# Patient Record
Sex: Female | Born: 1955 | Race: White | Hispanic: No | Marital: Married | Smoking: Never smoker
Health system: Southern US, Community
[De-identification: ages and names within clinical notes are randomized; demographics above are authoritative.]

## PROBLEM LIST (undated history)

## (undated) DIAGNOSIS — T7840XA Allergy, unspecified, initial encounter: Secondary | ICD-10-CM

## (undated) DIAGNOSIS — K219 Gastro-esophageal reflux disease without esophagitis: Secondary | ICD-10-CM

## (undated) DIAGNOSIS — E785 Hyperlipidemia, unspecified: Secondary | ICD-10-CM

## (undated) DIAGNOSIS — J45909 Unspecified asthma, uncomplicated: Secondary | ICD-10-CM

## (undated) DIAGNOSIS — M8580 Other specified disorders of bone density and structure, unspecified site: Secondary | ICD-10-CM

## (undated) HISTORY — DX: Other specified disorders of bone density and structure, unspecified site: M85.80

## (undated) HISTORY — DX: Allergy, unspecified, initial encounter: T78.40XA

## (undated) HISTORY — DX: Unspecified asthma, uncomplicated: J45.909

## (undated) HISTORY — PX: TUBAL LIGATION: SHX77

## (undated) HISTORY — DX: Hyperlipidemia, unspecified: E78.5

## (undated) HISTORY — DX: Gastro-esophageal reflux disease without esophagitis: K21.9

## (undated) HISTORY — PX: COLONOSCOPY: SHX174

---

## 2000-08-07 ENCOUNTER — Other Ambulatory Visit: Admission: RE | Admit: 2000-08-07 | Discharge: 2000-08-07 | Payer: Self-pay | Admitting: Family Medicine

## 2002-05-19 ENCOUNTER — Other Ambulatory Visit: Admission: RE | Admit: 2002-05-19 | Discharge: 2002-05-19 | Payer: Self-pay | Admitting: Family Medicine

## 2012-05-27 ENCOUNTER — Encounter (INDEPENDENT_AMBULATORY_CARE_PROVIDER_SITE_OTHER): Payer: 59 | Admitting: Obstetrics & Gynecology

## 2012-05-27 DIAGNOSIS — N952 Postmenopausal atrophic vaginitis: Secondary | ICD-10-CM

## 2012-06-16 ENCOUNTER — Other Ambulatory Visit: Payer: Self-pay | Admitting: *Deleted

## 2012-06-16 DIAGNOSIS — Z780 Asymptomatic menopausal state: Secondary | ICD-10-CM

## 2012-06-24 ENCOUNTER — Encounter: Payer: Self-pay | Admitting: *Deleted

## 2012-06-25 ENCOUNTER — Telehealth: Payer: Self-pay | Admitting: Nurse Practitioner

## 2012-06-25 ENCOUNTER — Other Ambulatory Visit: Payer: Self-pay | Admitting: Nurse Practitioner

## 2012-06-25 MED ORDER — ATORVASTATIN CALCIUM 20 MG PO TABS: 20.0000 mg | ORAL_TABLET | Freq: Every day | ORAL | Status: DC

## 2012-06-25 NOTE — Telephone Encounter (Signed)
Sent to [facility]

## 2012-06-25 NOTE — Telephone Encounter (Signed)
Patient aware meds sent to [facility]

## 2012-06-25 NOTE — Telephone Encounter (Signed)
Pt was seen here in January. Can't have labs done until after April 1st. Needs refill on cholesterol meds. [facility]

## 2012-07-07 ENCOUNTER — Other Ambulatory Visit (INDEPENDENT_AMBULATORY_CARE_PROVIDER_SITE_OTHER): Payer: 59

## 2012-07-07 DIAGNOSIS — E559 Vitamin D deficiency, unspecified: Secondary | ICD-10-CM

## 2012-07-07 DIAGNOSIS — E785 Hyperlipidemia, unspecified: Secondary | ICD-10-CM

## 2012-07-07 LAB — BASIC METABOLIC PANEL
BUN: 14 mg/dL (ref 6–23)
CO2: 27 mEq/L (ref 19–32)
Chloride: 104 mEq/L (ref 96–112)
Glucose, Bld: 94 mg/dL (ref 70–99)
Potassium: 4.9 mEq/L (ref 3.5–5.3)
Sodium: 139 mEq/L (ref 135–145)

## 2012-07-07 LAB — HEPATIC FUNCTION PANEL
ALT: 67 U/L — ABNORMAL HIGH (ref 0–35)
Alkaline Phosphatase: 101 U/L (ref 39–117)
Bilirubin, Direct: 0.1 mg/dL (ref 0.0–0.3)
Indirect Bilirubin: 0.6 mg/dL (ref 0.0–0.9)

## 2012-07-07 NOTE — Progress Notes (Signed)
Pt came in for labs only 

## 2012-07-08 LAB — NMR LIPOPROFILE WITH LIPIDS
Cholesterol, Total: 161 mg/dL (ref <200)
HDL Particle Number: 35.6 umol/L (ref >=30.5)
LDL Particle Number: 726 nmol/L (ref <1000)
Large VLDL-P: 2.3 nmol/L (ref <=2.7)
Small LDL Particle Number: 192 nmol/L (ref <=527)
Triglycerides: 70 mg/dL (ref <150)
VLDL Size: 46.2 nm (ref <=46.6)

## 2012-08-13 ENCOUNTER — Ambulatory Visit (INDEPENDENT_AMBULATORY_CARE_PROVIDER_SITE_OTHER): Payer: 59

## 2012-08-13 ENCOUNTER — Ambulatory Visit: Payer: Self-pay

## 2012-08-13 DIAGNOSIS — Z780 Asymptomatic menopausal state: Secondary | ICD-10-CM

## 2013-03-24 ENCOUNTER — Other Ambulatory Visit: Payer: Self-pay | Admitting: Nurse Practitioner

## 2013-03-30 ENCOUNTER — Other Ambulatory Visit: Payer: Self-pay | Admitting: General Practice

## 2013-03-30 ENCOUNTER — Telehealth: Payer: Self-pay | Admitting: Nurse Practitioner

## 2013-03-30 MED ORDER — ATORVASTATIN CALCIUM 20 MG PO TABS: 20.0000 mg | ORAL_TABLET | Freq: Every day | ORAL | Status: DC

## 2013-03-30 NOTE — Telephone Encounter (Signed)
Script sent to pharmacy.

## 2013-05-04 ENCOUNTER — Encounter (INDEPENDENT_AMBULATORY_CARE_PROVIDER_SITE_OTHER): Payer: Self-pay

## 2013-05-04 ENCOUNTER — Ambulatory Visit (INDEPENDENT_AMBULATORY_CARE_PROVIDER_SITE_OTHER): Payer: 59 | Admitting: Nurse Practitioner

## 2013-05-04 ENCOUNTER — Encounter: Payer: Self-pay | Admitting: Nurse Practitioner

## 2013-05-04 VITALS — BP 119/75 | HR 70 | Temp 96.9°F | Ht 62.25 in | Wt 149.0 lb

## 2013-05-04 DIAGNOSIS — Z124 Encounter for screening for malignant neoplasm of cervix: Secondary | ICD-10-CM

## 2013-05-04 DIAGNOSIS — Z01419 Encounter for gynecological examination (general) (routine) without abnormal findings: Secondary | ICD-10-CM

## 2013-05-04 DIAGNOSIS — Z0000 Encounter for general adult medical examination without abnormal findings: Secondary | ICD-10-CM

## 2013-05-04 DIAGNOSIS — E785 Hyperlipidemia, unspecified: Secondary | ICD-10-CM

## 2013-05-04 LAB — POCT URINALYSIS DIPSTICK
BILIRUBIN UA: NEGATIVE
Blood, UA: NEGATIVE
GLUCOSE UA: NEGATIVE
KETONES UA: NEGATIVE
Leukocytes, UA: NEGATIVE
Nitrite, UA: NEGATIVE
Protein, UA: NEGATIVE
SPEC GRAV UA: 1.015
Urobilinogen, UA: NEGATIVE
pH, UA: 7.5

## 2013-05-04 LAB — POCT CBC
GRANULOCYTE PERCENT: 63.5 % (ref 37–80)
HCT, POC: 46.7 % (ref 37.7–47.9)
HEMOGLOBIN: 14.5 g/dL (ref 12.2–16.2)
Lymph, poc: 2.6 (ref 0.6–3.4)
MCH, POC: 28.6 pg (ref 27–31.2)
MCHC: 31.1 g/dL — AB (ref 31.8–35.4)
MCV: 91.9 fL (ref 80–97)
MPV: 8.8 fL (ref 0–99.8)
POC GRANULOCYTE: 5.3 (ref 2–6.9)
POC LYMPH %: 30.5 % (ref 10–50)
Platelet Count, POC: 269 10*3/uL (ref 142–424)
RBC: 5.1 M/uL (ref 4.04–5.48)
RDW, POC: 13.6 %
WBC: 8.4 10*3/uL (ref 4.6–10.2)

## 2013-05-04 LAB — POCT UA - MICROSCOPIC ONLY
Casts, Ur, LPF, POC: NEGATIVE
Crystals, Ur, HPF, POC: NEGATIVE
RBC, URINE, MICROSCOPIC: NEGATIVE
Yeast, UA: NEGATIVE

## 2013-05-04 NOTE — Patient Instructions (Signed)

## 2013-05-04 NOTE — Progress Notes (Signed)
   Subjective:    Patient ID: Janice Long, female    DOB: 01-25-1956, 58 y.o.   MRN: [id number]   Patient in today for annual wellness exam and PAP- she is doing well without complaints.  Hyperlipidemia The current episode started more than 1 year ago. The problem is uncontrolled. Recent lipid tests were reviewed and are high. She has no history of diabetes, hypothyroidism, liver disease or obesity. There are no known factors aggravating her hyperlipidemia. Current antihyperlipidemic treatment includes statins. The current treatment provides moderate improvement of lipids. Compliance problems include adherence to diet.      Review of Systems  Constitutional: Negative.   HENT: Negative.   Eyes: Negative.   Respiratory: Negative.   Cardiovascular: Negative.   Genitourinary: Negative.   Musculoskeletal: Negative.   Neurological: Negative.   All other systems reviewed and are negative.       Objective:   Physical Exam  Constitutional: She is oriented to person, place, and time. She appears well-developed and well-nourished.  HENT:  Head: Normocephalic.  Right Ear: Hearing, tympanic membrane, external ear and ear canal normal.  Left Ear: Hearing, tympanic membrane, external ear and ear canal normal.  Nose: Nose normal.  Mouth/Throat: Uvula is midline and oropharynx is clear and moist.  Eyes: Conjunctivae and EOM are normal. Pupils are equal, round, and reactive to light.  Neck: Normal range of motion and full passive range of motion without pain. Neck supple. No JVD present. Carotid bruit is not present. No mass and no thyromegaly present.  Cardiovascular: Normal rate, normal heart sounds and intact distal pulses.   No murmur heard. Pulmonary/Chest: Effort normal and breath sounds normal. Right breast exhibits no inverted nipple, no mass, no nipple discharge, no skin change and no tenderness. Left breast exhibits no inverted nipple, no mass, no nipple discharge, no skin change and  no tenderness.  Abdominal: Soft. Bowel sounds are normal. She exhibits no mass. There is no tenderness.  Genitourinary: Vagina normal and uterus normal. No breast swelling, tenderness, discharge or bleeding.  bimanual exam-No adnexal masses or tenderness. Cervix parous and pink no discharge  Musculoskeletal: Normal range of motion.  Lymphadenopathy:    She has no cervical adenopathy.  Neurological: She is alert and oriented to person, place, and time.  Skin: Skin is warm and dry.  Psychiatric: She has a normal mood and affect. Her behavior is normal. Judgment and thought content normal.   BP 119/75  Pulse 70  Temp(Src) 96.9 F (36.1 C) (Oral)  Ht 5' 2.25" (1.581 m)  Wt 149 lb (67.586 kg)  BMI 27.04 kg/m2        Assessment & Plan:   1. Annual physical exam   2. Hyperlipidemia LDL goal < 100   3. Encounter for routine gynecological examination    Orders Placed This Encounter  Procedures  . CMP14+EGFR  . NMR, lipoprofile  . Thyroid Panel With TSH  . POCT urinalysis dipstick  . POCT UA - Microscopic Only  . POCT CBC    Labs pending Health maintenance reviewed Diet and exercise encouraged Continue all meds Follow up  In 6 months   [place], FNP

## 2013-05-05 LAB — PAP IG W/ RFLX HPV ASCU: PAP Smear Comment: 0

## 2013-05-06 ENCOUNTER — Telehealth: Payer: Self-pay | Admitting: *Deleted

## 2013-05-06 LAB — CMP14+EGFR
ALBUMIN: 4.6 g/dL (ref 3.5–5.5)
ALT: 25 IU/L (ref 0–32)
AST: 24 IU/L (ref 0–40)
Albumin/Globulin Ratio: 2 (ref 1.1–2.5)
Alkaline Phosphatase: 74 IU/L (ref 39–117)
BILIRUBIN TOTAL: 0.4 mg/dL (ref 0.0–1.2)
BUN/Creatinine Ratio: 13 (ref 9–23)
BUN: 10 mg/dL (ref 6–24)
CALCIUM: 9.7 mg/dL (ref 8.7–10.2)
CHLORIDE: 100 mmol/L (ref 97–108)
CO2: 25 mmol/L (ref 18–29)
Creatinine, Ser: 0.8 mg/dL (ref 0.57–1.00)
GFR calc non Af Amer: 82 mL/min/{1.73_m2} (ref >59)
GFR, EST AFRICAN AMERICAN: 94 mL/min/{1.73_m2} (ref >59)
GLUCOSE: 92 mg/dL (ref 65–99)
Globulin, Total: 2.3 g/dL (ref 1.5–4.5)
Potassium: 4.7 mmol/L (ref 3.5–5.2)
Sodium: 140 mmol/L (ref 134–144)
TOTAL PROTEIN: 6.9 g/dL (ref 6.0–8.5)

## 2013-05-06 LAB — NMR, LIPOPROFILE
CHOLESTEROL: 159 mg/dL (ref <200)
HDL Cholesterol by NMR: 61 mg/dL (ref >=40)
HDL Particle Number: 35.8 umol/L (ref >=30.5)
LDL PARTICLE NUMBER: 1164 nmol/L — AB (ref <1000)
LDL SIZE: 20.8 nm (ref >20.5)
LDLC SERPL CALC-MCNC: 80 mg/dL (ref <100)
LP-IR Score: 41 (ref <=45)
Small LDL Particle Number: 577 nmol/L — ABNORMAL HIGH (ref <=527)
Triglycerides by NMR: 91 mg/dL (ref <150)

## 2013-05-06 LAB — THYROID PANEL WITH TSH
Free Thyroxine Index: 1.9 (ref 1.2–4.9)
T3 UPTAKE RATIO: 27 % (ref 24–39)
T4, Total: 7.2 ug/dL (ref 4.5–12.0)
TSH: 1.37 u[IU]/mL (ref 0.450–4.500)

## 2013-05-06 NOTE — Telephone Encounter (Signed)
Message copied by Almeta MonasSTONE, Jeramia Saleeby M on Wed May 06, 2013  9:40 AM ------      Message from: Bennie PieriniMARTIN, MARY-MARGARET      Created: Wed May 06, 2013  8:12 AM       All labs look great - cholesterol looks good but LDL particle numbers have increased since last visit-= continue all meds- low fat diet and exercise and recheck in 3 months to make sure do not continue to worsen ------

## 2013-05-06 NOTE — Telephone Encounter (Signed)
LM, all labs look great except cholesterol is up some.  Diet and exercise more to help with this.

## 2013-06-08 ENCOUNTER — Other Ambulatory Visit: Payer: Self-pay

## 2013-06-08 MED ORDER — ATORVASTATIN CALCIUM 20 MG PO TABS: 20.0000 mg | ORAL_TABLET | Freq: Every day | ORAL | Status: DC

## 2014-02-01 ENCOUNTER — Other Ambulatory Visit: Payer: Self-pay | Admitting: Nurse Practitioner

## 2014-03-03 ENCOUNTER — Other Ambulatory Visit: Payer: Self-pay | Admitting: Nurse Practitioner

## 2014-03-29 ENCOUNTER — Other Ambulatory Visit: Payer: Self-pay | Admitting: Family Medicine

## 2014-06-01 ENCOUNTER — Encounter (INDEPENDENT_AMBULATORY_CARE_PROVIDER_SITE_OTHER): Payer: Self-pay

## 2014-06-01 ENCOUNTER — Encounter: Payer: Self-pay | Admitting: Nurse Practitioner

## 2014-06-01 ENCOUNTER — Ambulatory Visit (INDEPENDENT_AMBULATORY_CARE_PROVIDER_SITE_OTHER): Payer: 59 | Admitting: Nurse Practitioner

## 2014-06-01 VITALS — BP 112/68 | HR 61 | Temp 97.2°F | Ht 62.0 in | Wt 133.0 lb

## 2014-06-01 DIAGNOSIS — Z0000 Encounter for general adult medical examination without abnormal findings: Secondary | ICD-10-CM | POA: Diagnosis not present

## 2014-06-01 DIAGNOSIS — Z01419 Encounter for gynecological examination (general) (routine) without abnormal findings: Secondary | ICD-10-CM

## 2014-06-01 DIAGNOSIS — E785 Hyperlipidemia, unspecified: Secondary | ICD-10-CM | POA: Diagnosis not present

## 2014-06-01 LAB — POCT UA - MICROSCOPIC ONLY
BACTERIA, U MICROSCOPIC: NEGATIVE
CASTS, UR, LPF, POC: NEGATIVE
Crystals, Ur, HPF, POC: NEGATIVE
Mucus, UA: NEGATIVE
YEAST UA: NEGATIVE

## 2014-06-01 LAB — POCT URINALYSIS DIPSTICK
Bilirubin, UA: NEGATIVE
Glucose, UA: NEGATIVE
KETONES UA: NEGATIVE
Nitrite, UA: NEGATIVE
PROTEIN UA: NEGATIVE
SPEC GRAV UA: 1.01
Urobilinogen, UA: NEGATIVE
pH, UA: 6

## 2014-06-01 MED ORDER — ATORVASTATIN CALCIUM 20 MG PO TABS: ORAL_TABLET | ORAL | Status: DC

## 2014-06-01 NOTE — Patient Instructions (Signed)
Pap Test A Pap test checks the cells on the surface of your cervix. Your doctor will look for cell changes that are not normal, an infection, or cancer. If the cells no longer look normal, it is called dysplasia. Dysplasia can turn into cancer. Regular Pap tests are important to stop cancer from developing. BEFORE THE PROCEDURE  Ask your doctor when to schedule your Pap test. Timing the test around your period may be important.  Do not douche or have sex (intercourse) for 24 hours before the test.  Do not put creams on your vagina or use tampons for 24 hours before the test.  Go pee (urinate) just before the test. PROCEDURE  You will lie on an exam table with your feet in stirrups.  A warm metal or plastic tool (speculum) will be put in your vagina to open it up.  Your doctor will use a small, plastic brush or wooden spatula to take cells from your cervix.  The cells will be put in a lab container.  The cells will be checked under a microscope to see if they are normal or not. AFTER THE PROCEDURE Get your test results. If they are abnormal, you may need more tests. Document Released: 04/21/2010 Document Revised: 06/11/2011 Document Reviewed: 03/15/2011 Exit[employer] Patient Information 2015 [employer]. This information is not intended to replace advice given to you by your health care provider. Make sure you discuss any questions you have with your health care provider.  

## 2014-06-01 NOTE — Addendum Note (Signed)
Addended by: Prescott GumLAND, Conley Pawling M on: 06/01/2014 11:32 AM   Modules accepted: Orders

## 2014-06-01 NOTE — Progress Notes (Signed)
   Subjective:    Patient ID: Janice Long, female    DOB: 1955-12-29, 59 y.o.   MRN: [id number]   Patient in today for annual wellness exam and PAP- she is doing well no acute complaints.  Hyperlipidemia This is a chronic problem. The current episode started more than 1 year ago. The problem is controlled. Pertinent negatives include no chest pain. Current antihyperlipidemic treatment includes statins. The current treatment provides significant improvement of lipids. There are no compliance problems.  Risk factors for coronary artery disease include post-menopausal and dyslipidemia.     Review of Systems  Constitutional: Negative.   HENT: Negative.   Eyes: Negative.   Respiratory: Negative.   Cardiovascular: Negative.  Negative for chest pain.  Genitourinary: Negative.   Musculoskeletal: Negative.   Neurological: Negative.   All other systems reviewed and are negative.      Objective:   Physical Exam  Constitutional: She is oriented to person, place, and time. She appears well-developed and well-nourished.  HENT:  Head: Normocephalic.  Right Ear: Hearing, tympanic membrane, external ear and ear canal normal.  Left Ear: Hearing, tympanic membrane, external ear and ear canal normal.  Nose: Nose normal.  Mouth/Throat: Uvula is midline and oropharynx is clear and moist.  Eyes: Conjunctivae and EOM are normal. Pupils are equal, round, and reactive to light.  Neck: Normal range of motion and full passive range of motion without pain. Neck supple. No JVD present. Carotid bruit is not present. No thyroid mass and no thyromegaly present.  Cardiovascular: Normal rate, normal heart sounds and intact distal pulses.   No murmur heard. Pulmonary/Chest: Effort normal and breath sounds normal. Right breast exhibits no inverted nipple, no mass, no nipple discharge, no skin change and no tenderness. Left breast exhibits no inverted nipple, no mass, no nipple discharge, no skin change and no  tenderness.  Abdominal: Soft. Bowel sounds are normal. She exhibits no mass. There is no tenderness.  Genitourinary: Vagina normal and uterus normal. No breast swelling, tenderness, discharge or bleeding.  bimanual exam-No adnexal masses or tenderness. Cervix parous and pink no discharge  Musculoskeletal: Normal range of motion.  Lymphadenopathy:    She has no cervical adenopathy.  Neurological: She is alert and oriented to person, place, and time.  Skin: Skin is warm and dry.  Psychiatric: She has a normal mood and affect. Her behavior is normal. Judgment and thought content normal.   BP 112/68 mmHg  Pulse 61  Temp(Src) 97.2 F (36.2 C) (Oral)  Ht $R'5\' 2"'WZ$  (1.575 m)  Wt 133 lb (60.328 kg)  BMI 24.32 kg/m2        Assessment & Plan:    1. Annual physical exam - POCT UA - Microscopic Only - POCT urinalysis dipstick - CMP14+EGFR - CBC - Basic metabolic panel - Thyroid Panel With TSH - Vit D  25 hydroxy (rtn osteoporosis monitoring)  2. Hyperlipidemia with target LDL less than 100 Low fat diet - NMR, lipoprofile  3. Encounter for routine gynecological examination - Pap IG w/ reflex to HPV when ASC-U    Labs pending Health maintenance reviewed Diet and exercise encouraged Continue all meds Follow up  In 6 month   [place], FNP

## 2014-06-01 NOTE — Addendum Note (Signed)
Addended by: Prescott GumLAND, Madelin Weseman M on: 06/01/2014 11:26 AM   Modules accepted: Kipp BroodSmartSet

## 2014-06-02 LAB — NMR, LIPOPROFILE
CHOLESTEROL: 182 mg/dL (ref 100–199)
HDL Cholesterol by NMR: 72 mg/dL (ref >39)
HDL Particle Number: 32.6 umol/L (ref >=30.5)
LDL Particle Number: 945 nmol/L (ref <1000)
LDL SIZE: 21.1 nm (ref >20.5)
LDL-C: 95 mg/dL (ref 0–99)
Small LDL Particle Number: 244 nmol/L (ref <=527)
Triglycerides by NMR: 76 mg/dL (ref 0–149)

## 2014-06-02 LAB — CBC
HCT: 43.4 % (ref 34.0–46.6)
HEMOGLOBIN: 14.2 g/dL (ref 11.1–15.9)
MCH: 29 pg (ref 26.6–33.0)
MCHC: 32.7 g/dL (ref 31.5–35.7)
MCV: 89 fL (ref 79–97)
Platelets: 294 10*3/uL (ref 150–379)
RBC: 4.9 x10E6/uL (ref 3.77–5.28)
RDW: 14.6 % (ref 12.3–15.4)
WBC: 6.7 10*3/uL (ref 3.4–10.8)

## 2014-06-02 LAB — CMP14+EGFR
ALBUMIN: 4.7 g/dL (ref 3.5–5.5)
ALT: 22 IU/L (ref 0–32)
AST: 22 IU/L (ref 0–40)
Albumin/Globulin Ratio: 1.7 (ref 1.1–2.5)
Alkaline Phosphatase: 71 IU/L (ref 39–117)
BUN/Creatinine Ratio: 18 (ref 9–23)
BUN: 17 mg/dL (ref 6–24)
Bilirubin Total: 0.5 mg/dL (ref 0.0–1.2)
CALCIUM: 10.1 mg/dL (ref 8.7–10.2)
CO2: 24 mmol/L (ref 18–29)
CREATININE: 0.95 mg/dL (ref 0.57–1.00)
Chloride: 102 mmol/L (ref 97–108)
GFR calc Af Amer: 76 mL/min/{1.73_m2} (ref >59)
GFR calc non Af Amer: 66 mL/min/{1.73_m2} (ref >59)
GLUCOSE: 89 mg/dL (ref 65–99)
Globulin, Total: 2.8 g/dL (ref 1.5–4.5)
Potassium: 4.9 mmol/L (ref 3.5–5.2)
Sodium: 142 mmol/L (ref 134–144)
TOTAL PROTEIN: 7.5 g/dL (ref 6.0–8.5)

## 2014-06-02 LAB — THYROID PANEL WITH TSH
FREE THYROXINE INDEX: 2.1 (ref 1.2–4.9)
T3 UPTAKE RATIO: 24 % (ref 24–39)
T4 TOTAL: 8.6 ug/dL (ref 4.5–12.0)
TSH: 1.18 u[IU]/mL (ref 0.450–4.500)

## 2014-06-02 LAB — VITAMIN D 25 HYDROXY (VIT D DEFICIENCY, FRACTURES): VIT D 25 HYDROXY: 28.5 ng/mL — AB (ref 30.0–100.0)

## 2014-06-04 LAB — PAP IG W/ RFLX HPV ASCU: PAP Smear Comment: 0

## 2015-01-19 ENCOUNTER — Other Ambulatory Visit: Payer: Self-pay | Admitting: Nurse Practitioner

## 2015-01-19 NOTE — Telephone Encounter (Signed)
Last seen 06/01/14 MMM Last lipid 06/01/14

## 2015-01-19 NOTE — Telephone Encounter (Signed)
Last refill without being seen 

## 2015-01-19 NOTE — Telephone Encounter (Signed)
Detailed message left for patient that rx has be sent into pharmacy and will she will need to be seen for any further.

## 2015-02-27 ENCOUNTER — Other Ambulatory Visit: Payer: Self-pay | Admitting: Nurse Practitioner

## 2015-02-28 NOTE — Telephone Encounter (Signed)
Last seen and last lipid 06/01/14  MMM

## 2015-02-28 NOTE — Telephone Encounter (Signed)
Last refill without being seen 

## 2015-03-15 ENCOUNTER — Other Ambulatory Visit: Payer: Self-pay | Admitting: Nurse Practitioner

## 2015-03-15 NOTE — Telephone Encounter (Signed)
Last seen 06/01/14  MMM

## 2015-05-07 ENCOUNTER — Other Ambulatory Visit: Payer: Self-pay | Admitting: Nurse Practitioner

## 2015-05-09 NOTE — Telephone Encounter (Signed)
Last labs 3/16

## 2015-05-09 NOTE — Telephone Encounter (Signed)
Patient scheduled an appointment for 3/2 for a physical. Patient states that she only see's you once a year and wants to know if you will fill until then

## 2015-05-09 NOTE — Telephone Encounter (Signed)
lipitor refill denied-Patient NTBS for follow up and lab work  

## 2015-05-09 NOTE — Telephone Encounter (Signed)
Okay for her to wait until appointment- i need to make sure kidney function is okay

## 2015-05-14 ENCOUNTER — Other Ambulatory Visit: Payer: Self-pay | Admitting: Nurse Practitioner

## 2015-06-02 ENCOUNTER — Ambulatory Visit: Payer: 59

## 2015-06-02 ENCOUNTER — Encounter: Payer: Self-pay | Admitting: Nurse Practitioner

## 2015-06-02 ENCOUNTER — Ambulatory Visit (INDEPENDENT_AMBULATORY_CARE_PROVIDER_SITE_OTHER): Payer: 59 | Admitting: Nurse Practitioner

## 2015-06-02 ENCOUNTER — Ambulatory Visit (INDEPENDENT_AMBULATORY_CARE_PROVIDER_SITE_OTHER): Payer: 59

## 2015-06-02 VITALS — BP 110/72 | HR 75 | Temp 97.2°F | Ht 62.0 in | Wt 146.0 lb

## 2015-06-02 DIAGNOSIS — Z13820 Encounter for screening for osteoporosis: Secondary | ICD-10-CM

## 2015-06-02 DIAGNOSIS — Z1159 Encounter for screening for other viral diseases: Secondary | ICD-10-CM

## 2015-06-02 DIAGNOSIS — Z1212 Encounter for screening for malignant neoplasm of rectum: Secondary | ICD-10-CM

## 2015-06-02 DIAGNOSIS — Z6826 Body mass index (BMI) 26.0-26.9, adult: Secondary | ICD-10-CM | POA: Diagnosis not present

## 2015-06-02 DIAGNOSIS — E785 Hyperlipidemia, unspecified: Secondary | ICD-10-CM | POA: Diagnosis not present

## 2015-06-02 DIAGNOSIS — Z0000 Encounter for general adult medical examination without abnormal findings: Secondary | ICD-10-CM | POA: Diagnosis not present

## 2015-06-02 DIAGNOSIS — N904 Leukoplakia of vulva: Secondary | ICD-10-CM | POA: Diagnosis not present

## 2015-06-02 DIAGNOSIS — Z23 Encounter for immunization: Secondary | ICD-10-CM

## 2015-06-02 DIAGNOSIS — Z01419 Encounter for gynecological examination (general) (routine) without abnormal findings: Secondary | ICD-10-CM

## 2015-06-02 DIAGNOSIS — E559 Vitamin D deficiency, unspecified: Secondary | ICD-10-CM

## 2015-06-02 LAB — POCT URINALYSIS DIPSTICK
BILIRUBIN UA: NEGATIVE
Glucose, UA: NEGATIVE
Ketones, UA: NEGATIVE
Nitrite, UA: NEGATIVE
PH UA: 5
Protein, UA: NEGATIVE
Spec Grav, UA: >=1.03
Urobilinogen, UA: NEGATIVE

## 2015-06-02 LAB — POCT UA - MICROSCOPIC ONLY
Casts, Ur, LPF, POC: NEGATIVE
Crystals, Ur, HPF, POC: NEGATIVE
YEAST UA: NEGATIVE

## 2015-06-02 MED ORDER — ATORVASTATIN CALCIUM 20 MG PO TABS: ORAL_TABLET | ORAL | Status: DC

## 2015-06-02 MED ORDER — CLOBETASOL PROP EMOLLIENT BASE 0.05 % EX CREA: 1.0000 "application " | TOPICAL_CREAM | Freq: Two times a day (BID) | CUTANEOUS | Status: DC

## 2015-06-02 NOTE — Addendum Note (Signed)
Addended by: Bennie Pierini on: 06/02/2015 08:38 AM   Modules accepted: Kipp Brood

## 2015-06-02 NOTE — Patient Instructions (Signed)
Lichen Sclerosus Lichen sclerosus is a skin problem. It can happen on any part of the body, but it commonly involves the anal or genital areas. It can cause itching and discomfort in these areas. Treatment can help to control symptoms. When the genital area is affected, getting treatment is important because the condition can cause scarring that may lead to other problems. CAUSES The cause of this condition is not known. It could be the result of an overactive immune system or a lack of certain hormones. Lichen sclerosus is not an infection or a fungus. It is not passed from one person to another (not contagious). RISK FACTORS This condition is more likely to develop in women, usually after menopause. SYMPTOMS Symptoms of this condition include:  Thin, wrinkled, white areas on the skin.  Thickened white areas on the skin.  Red and swollen patches (lesions) on the skin.  Tears or cracks in the skin.  Bruising.  Blood blisters.  Severe itching. You may also have pain, itching, or burning with urination. Constipation is also common in people with lichen sclerosus. DIAGNOSIS This condition may be diagnosed with a physical exam. In some cases, a tissue sample (biopsy sample) may be removed to be looked at under a microscope. TREATMENT This condition is usually treated with medicated creams or ointments (topical steroids) that are applied over the affected areas. HOME CARE INSTRUCTIONS  Take over-the-counter and prescription medicines only as told by your health care provider.  Use creams or ointments as told by your health care provider.  Do not scratch the affected areas of skin.  Women should keep the vaginal area as clean and dry as possible.  Keep all follow-up visits as told by your health care provider. This is important. SEEK MEDICAL CARE IF:  You have increasing redness, swelling, or pain in the affected area.  You have fluid, blood, or pus coming from the affected  area.  You have new lesions on your skin.  You have pain or burning with urination.  You have pain during sex.   This information is not intended to replace advice given to you by your health care provider. Make sure you discuss any questions you have with your health care provider.   Document Released: 08/09/2010 Document Revised: 12/08/2014 Document Reviewed: 06/14/2014 Elsevier Interactive Patient Education 2016 Elsevier Inc.  

## 2015-06-02 NOTE — Progress Notes (Signed)
Subjective:    Patient ID: Janice Long, female    DOB: July 22, 1955, 60 y.o.   MRN: [id number]    Patient here today for her annual physical exam and PAP. She is doing well today without complaints.  Outpatient Encounter Prescriptions as of 06/02/2015  Medication Sig  . atorvastatin (LIPITOR) 20 MG tablet TAKE 1 TABLET (20 MG TOTAL) BY MOUTH DAILY.  . cholecalciferol (VITAMIN D) 1000 units tablet Take 1,000 Units by mouth daily.   No facility-administered encounter medications on file as of 06/02/2015.      Hyperlipidemia This is a chronic problem. The current episode started more than 1 year ago. The problem is controlled. Pertinent negatives include no chest pain. Current antihyperlipidemic treatment includes statins. The current treatment provides significant improvement of lipids. There are no compliance problems.  Risk factors for coronary artery disease include post-menopausal and dyslipidemia.     Review of Systems  Constitutional: Negative.   HENT: Negative.   Eyes: Negative.   Respiratory: Negative.   Cardiovascular: Negative.  Negative for chest pain.  Genitourinary: Negative.   Musculoskeletal: Negative.   Neurological: Negative.   All other systems reviewed and are negative.      Objective:   Physical Exam  Constitutional: She is oriented to person, place, and time. She appears well-developed and well-nourished.  HENT:  Head: Normocephalic.  Right Ear: Hearing, tympanic membrane, external ear and ear canal normal.  Left Ear: Hearing, tympanic membrane, external ear and ear canal normal.  Nose: Nose normal.  Mouth/Throat: Uvula is midline and oropharynx is clear and moist.  Eyes: Conjunctivae and EOM are normal. Pupils are equal, round, and reactive to light.  Neck: Normal range of motion and full passive range of motion without pain. Neck supple. No JVD present. Carotid bruit is not present. No thyroid mass and no thyromegaly present.  Cardiovascular: Normal  rate, normal heart sounds and intact distal pulses.   No murmur heard. Pulmonary/Chest: Effort normal and breath sounds normal. Right breast exhibits no inverted nipple, no mass, no nipple discharge, no skin change and no tenderness. Left breast exhibits no inverted nipple, no mass, no nipple discharge, no skin change and no tenderness.  Abdominal: Soft. Bowel sounds are normal. She exhibits no mass. There is no tenderness.  Genitourinary: Vagina normal and uterus normal. No breast swelling, tenderness, discharge or bleeding.  bimanual exam-No adnexal masses or tenderness. Cervix parous and pink no discharge Thickened white skin around upper labia bil with slight adhesions- and around annual area  Musculoskeletal: Normal range of motion.  Lymphadenopathy:    She has no cervical adenopathy.  Neurological: She is alert and oriented to person, place, and time.  Skin: Skin is warm and dry.  Psychiatric: She has a normal mood and affect. Her behavior is normal. Judgment and thought content normal.   BP 110/72 mmHg  Pulse 75  Temp(Src) 97.2 F (36.2 C) (Oral)  Ht _0  (1.575 m)  Wt 146 lb (66.225 kg)  BMI 26.70 kg/m2  Chest x ray- no cardiopulmonary abnormalities-Preliminary reading by Ronnald Collum, FNP  [facility]  EKG- Kerry Hough, FNP     Assessment & Plan:   1. Annual physical exam - POCT UA - Microscopic Only - POCT urinalysis dipstick - CBC with Differential/Platelet - CMP14+EGFR - Thyroid Panel With TSH  2. Encounter for routine gynecological examination - Pap IG w/ reflex to HPV when ASC-U  3. Hyperlipidemia with target LDL less than 100 Low fat diet - DG Chest  2 View; Future - EKG 12-Lead - Lipid panel - atorvastatin (LIPITOR) 20 MG tablet; TAKE 1 TABLET (20 MG TOTAL) BY MOUTH DAILY.  Dispense: 90 tablet; Refill: 3  4. Vitamin D deficiency - VITAMIN D 25 Hydroxy (Vit-D Deficiency, Fractures)  5. BMI 26.0-26.9,adult Discussed diet and exercise for person  with BMI >25 Will recheck weight in 3-6 months  6. Screening for malignant neoplasm of the rectum - Fecal occult blood, imunochemical; Future  7. Need for hepatitis C screening test - Hepatitis C antibody  8. Lichen sclerosus of female genitalia Avoid hot baths Try not to scratch - Clobetasol Prop Emollient Base (CLOBETASOL PROPIONATE E) 0.05 % emollient cream; Apply 1 application topically 2 (two) times daily.  Dispense: 60 g; Refill: 3    Labs pending Health maintenance reviewed Diet and exercise encouraged Continue all meds Follow up  In 1 year   [place], FNP

## 2015-06-03 LAB — CBC WITH DIFFERENTIAL/PLATELET
BASOS ABS: 0 10*3/uL (ref 0.0–0.2)
Basos: 0 %
EOS (ABSOLUTE): 0.7 10*3/uL — AB (ref 0.0–0.4)
Eos: 9 %
Hematocrit: 41.6 % (ref 34.0–46.6)
Hemoglobin: 13.9 g/dL (ref 11.1–15.9)
IMMATURE GRANULOCYTES: 0 %
Immature Grans (Abs): 0 10*3/uL (ref 0.0–0.1)
LYMPHS: 29 %
Lymphocytes Absolute: 2.2 10*3/uL (ref 0.7–3.1)
MCH: 30.1 pg (ref 26.6–33.0)
MCHC: 33.4 g/dL (ref 31.5–35.7)
MCV: 90 fL (ref 79–97)
MONOS ABS: 0.7 10*3/uL (ref 0.1–0.9)
Monocytes: 10 %
NEUTROS PCT: 52 %
Neutrophils Absolute: 3.9 10*3/uL (ref 1.4–7.0)
PLATELETS: 288 10*3/uL (ref 150–379)
RBC: 4.62 x10E6/uL (ref 3.77–5.28)
RDW: 13.9 % (ref 12.3–15.4)
WBC: 7.5 10*3/uL (ref 3.4–10.8)

## 2015-06-03 LAB — CMP14+EGFR
A/G RATIO: 1.7 (ref 1.1–2.5)
ALT: 25 IU/L (ref 0–32)
AST: 27 IU/L (ref 0–40)
Albumin: 4.2 g/dL (ref 3.6–4.8)
Alkaline Phosphatase: 59 IU/L (ref 39–117)
BUN/Creatinine Ratio: 11 (ref 11–26)
BUN: 10 mg/dL (ref 8–27)
Bilirubin Total: 0.5 mg/dL (ref 0.0–1.2)
CALCIUM: 9.6 mg/dL (ref 8.7–10.3)
CO2: 23 mmol/L (ref 18–29)
CREATININE: 0.91 mg/dL (ref 0.57–1.00)
Chloride: 103 mmol/L (ref 96–106)
GFR calc Af Amer: 79 mL/min/{1.73_m2} (ref >59)
GFR, EST NON AFRICAN AMERICAN: 69 mL/min/{1.73_m2} (ref >59)
GLUCOSE: 94 mg/dL (ref 65–99)
Globulin, Total: 2.5 g/dL (ref 1.5–4.5)
POTASSIUM: 4.4 mmol/L (ref 3.5–5.2)
Sodium: 142 mmol/L (ref 134–144)
Total Protein: 6.7 g/dL (ref 6.0–8.5)

## 2015-06-03 LAB — LIPID PANEL
CHOL/HDL RATIO: 2.2 ratio (ref 0.0–4.4)
Cholesterol, Total: 126 mg/dL (ref 100–199)
HDL: 58 mg/dL (ref >39)
LDL Calculated: 46 mg/dL (ref 0–99)
TRIGLYCERIDES: 111 mg/dL (ref 0–149)
VLDL Cholesterol Cal: 22 mg/dL (ref 5–40)

## 2015-06-03 LAB — THYROID PANEL WITH TSH
FREE THYROXINE INDEX: 1.9 (ref 1.2–4.9)
T3 UPTAKE RATIO: 25 % (ref 24–39)
T4 TOTAL: 7.6 ug/dL (ref 4.5–12.0)
TSH: 1.08 u[IU]/mL (ref 0.450–4.500)

## 2015-06-03 LAB — VITAMIN D 25 HYDROXY (VIT D DEFICIENCY, FRACTURES): Vit D, 25-Hydroxy: 47.8 ng/mL (ref 30.0–100.0)

## 2015-06-03 LAB — HEPATITIS C ANTIBODY: Hep C Virus Ab: <0.1 s/co ratio (ref 0.0–0.9)

## 2015-06-06 ENCOUNTER — Other Ambulatory Visit: Payer: Self-pay | Admitting: Nurse Practitioner

## 2015-06-06 ENCOUNTER — Telehealth: Payer: Self-pay | Admitting: Nurse Practitioner

## 2015-06-06 LAB — PAP IG W/ RFLX HPV ASCU: PAP Smear Comment: 0

## 2015-06-06 MED ORDER — TRIAMCINOLONE ACETONIDE 0.5 % EX CREA: 1.0000 "application " | TOPICAL_CREAM | Freq: Three times a day (TID) | CUTANEOUS | Status: DC

## 2015-06-06 MED ORDER — CIPROFLOXACIN HCL 500 MG PO TABS: 500.0000 mg | ORAL_TABLET | Freq: Two times a day (BID) | ORAL | Status: DC

## 2015-06-06 NOTE — Telephone Encounter (Signed)
Pt aware of lab results. Pt also states that her emollient cream is not covered under her insurance and would like to know if you could send in something else. Please advise

## 2015-06-06 NOTE — Addendum Note (Signed)
Addended by: Cleda DaubUCKER, AMANDA G on: 06/06/2015 10:54 AM   Modules accepted: Orders

## 2015-06-06 NOTE — Telephone Encounter (Signed)
Changed to triamcinolone cream

## 2015-06-07 ENCOUNTER — Other Ambulatory Visit: Payer: 59

## 2015-06-07 DIAGNOSIS — Z1212 Encounter for screening for malignant neoplasm of rectum: Secondary | ICD-10-CM

## 2015-06-07 NOTE — Telephone Encounter (Signed)
Patient notified

## 2015-06-09 LAB — FECAL OCCULT BLOOD, IMMUNOCHEMICAL: Fecal Occult Bld: NEGATIVE

## 2015-06-13 ENCOUNTER — Ambulatory Visit (INDEPENDENT_AMBULATORY_CARE_PROVIDER_SITE_OTHER): Payer: 59 | Admitting: Pharmacist

## 2015-06-13 ENCOUNTER — Encounter: Payer: Self-pay | Admitting: Pharmacist

## 2015-06-13 DIAGNOSIS — M8580 Other specified disorders of bone density and structure, unspecified site: Secondary | ICD-10-CM | POA: Diagnosis not present

## 2015-06-13 MED ORDER — ADULT MULTIVITAMIN W/MINERALS CH: 1.0000 | ORAL_TABLET | Freq: Every day | ORAL | Status: AC

## 2015-06-13 NOTE — Progress Notes (Signed)
Patient ID: Janice FlockLinda J Long, female   DOB: 1955-06-04, 60 y.o.   MRN: [id number]  [facility] Current Height:        Max Lifetime Height:  5\' 3"  Current Weight:         Ethnicity:Caucasian  BP:       HR:         HPI: Does pt already have a diagnosis of:  Osteopenia?  No Osteoporosis?  No  Back Pain?  No       Kyphosis?  No Prior fracture?  Yes - ankle as a teenager Med(s) for Osteoporosis/Osteopenia:  none Med(s) previously tried for Osteoporosis/Osteopenia:  none                                                             PMH: Age at menopause:  60 yo Hysterectomy?  No Oophorectomy?  No HRT? No Steroid Use?  No Thyroid med?  No History of cancer?  No History of digestive disorders (ie Crohn's)?  No Current or previous eating disorders?  No Last Vitamin D Result:  47.8 (06/02/2015) Last GFR Result:  69 (06/02/2015)   FH/SH: Family history of osteoporosis?  No but questionable mother Parent with history of hip fracture?  No Family history of breast cancer?  No Exercise?  Yes - twice weekly - cardio and core / weigth lifting Smoking?  No Alcohol?  Yes - occ wine    Calcium Assessment Calcium Intake  # of servings/day  Calcium mg  Milk (8 oz) 0  x  300  = 0  Yogurt (4 oz) 0 x  200 = 0  Cheese (1 oz) 1 x  200 = 200mg   Other Calcium sources   250mg   Ca supplement 0 = 0   Estimated calcium intake per day 450mg     DEXA Results Date of Test T-Score for AP Spine L1-L4 T-Score for Total Left Hip T-Score for Total Right Hip  06/02/2015 -0.8 -0.9 -1.0  08/13/2012 -0.7 -0.6 -0.6            Lowest T-Score = -1.4 at neck of right hip  FRAX 10 year estimate: Total FX risk:  7.8% (consider medication if >/= 20%) Hip FX risk:  0.6%  (consider medication if >/= 3%)  Assessment: Osteopenia - with low FRAX estimate; low calcium intake  Recommendations: 1.   Discussed BMD  / DEXA results and discussed fracture risk. 2.  recommend calcium 1200mg  daily through  supplementation or diet.  3.  continue weight bearing exercise - 30 minutes at least 4 days per week.   4.  Counseled and educated about fall risk and prevention.  Recheck DEXA:  2 years  Time spent counseling patient:  20 minutes   Janice Long, PharmD, CPP

## 2015-06-13 NOTE — Patient Instructions (Signed)
Fall Prevention in the Home  Falls can cause injuries and can affect people from all age groups. There are many simple things that you can do to make your home safe and to help prevent falls. WHAT CAN I DO ON THE OUTSIDE OF MY HOME?  Regularly repair the edges of walkways and driveways and fix any cracks.  Remove high doorway thresholds.  Trim any shrubbery on the main path into your home.  Use bright outdoor lighting.  Clear walkways of debris and clutter, including tools and rocks.  Regularly check that handrails are securely fastened and in good repair. Both sides of any steps should have handrails.  Install guardrails along the edges of any raised decks or porches.  Have leaves, snow, and ice cleared regularly.  Use sand or salt on walkways during winter months.  In the garage, clean up any spills right away, including grease or oil spills. WHAT CAN I DO IN THE BATHROOM?  Use night lights.  Install grab bars by the toilet and in the tub and shower. Do not use towel bars as grab bars.  Use non-skid mats or decals on the floor of the tub or shower.  If you need to sit down while you are in the shower, use a plastic, non-slip stool..  Keep the floor dry. Immediately clean up any water that spills on the floor.  Remove soap buildup in the tub or shower on a regular basis.  Attach bath mats securely with double-sided non-slip rug tape.  Remove throw rugs and other tripping hazards from the floor. WHAT CAN I DO IN THE BEDROOM?  Use night lights.  Make sure that a bedside light is easy to reach.  Do not use oversized bedding that drapes onto the floor.  Have a firm chair that has side arms to use for getting dressed.  Remove throw rugs and other tripping hazards from the floor. WHAT CAN I DO IN THE KITCHEN?   Clean up any spills right away.  Avoid walking on wet floors.  Place frequently used items in easy-to-reach places.  If you need to reach for something  above you, use a sturdy step stool that has a grab bar.  Keep electrical cables out of the way.  Do not use floor polish or wax that makes floors slippery. If you have to use wax, make sure that it is non-skid floor wax.  Remove throw rugs and other tripping hazards from the floor. WHAT CAN I DO IN THE STAIRWAYS?  Do not leave any items on the stairs.  Make sure that there are handrails on both sides of the stairs. Fix handrails that are broken or loose. Make sure that handrails are as long as the stairways.  Check any carpeting to make sure that it is firmly attached to the stairs. Fix any carpet that is loose or worn.  Avoid having throw rugs at the top or bottom of stairways, or secure the rugs with carpet tape to prevent them from moving.  Make sure that you have a light switch at the top of the stairs and the bottom of the stairs. If you do not have them, have them installed. WHAT ARE SOME OTHER FALL PREVENTION TIPS?  Wear closed-toe shoes that fit well and support your feet. Wear shoes that have rubber soles or low heels.  When you use a stepladder, make sure that it is completely opened and that the sides are firmly locked. Have someone hold the ladder while you   are using it. Do not climb a closed stepladder.  Add color or contrast paint or tape to grab bars and handrails in your home. Place contrasting color strips on the first and last steps.  Use mobility aids as needed, such as canes, walkers, scooters, and crutches.  Turn on lights if it is dark. Replace any light bulbs that burn out.  Set up furniture so that there are clear paths. Keep the furniture in the same spot.  Fix any uneven floor surfaces.  Choose a carpet design that does not hide the edge of steps of a stairway.  Be aware of any and all pets.  Review your medicines with your healthcare provider. Some medicines can cause dizziness or changes in blood pressure, which increase your risk of falling. Talk  with your health care provider about other ways that you can decrease your risk of falls. This may include working with a physical therapist or trainer to improve your strength, balance, and endurance.   This information is not intended to replace advice given to you by your health care provider. Make sure you discuss any questions you have with your health care provider.   Document Released: 03/09/2002 Document Revised: 08/03/2014 Document Reviewed: 04/23/2014 Elsevier Interactive Patient Education 2016 Elsevier Inc.  

## 2015-08-24 ENCOUNTER — Encounter: Payer: Self-pay | Admitting: Nurse Practitioner

## 2015-08-24 ENCOUNTER — Ambulatory Visit (INDEPENDENT_AMBULATORY_CARE_PROVIDER_SITE_OTHER): Payer: 59 | Admitting: Nurse Practitioner

## 2015-08-24 VITALS — BP 112/72 | HR 84 | Temp 97.0°F | Ht 62.0 in | Wt 146.0 lb

## 2015-08-24 DIAGNOSIS — A609 Anogenital herpesviral infection, unspecified: Secondary | ICD-10-CM

## 2015-08-24 DIAGNOSIS — A6009 Herpesviral infection of other urogenital tract: Secondary | ICD-10-CM

## 2015-08-24 MED ORDER — VALACYCLOVIR HCL 1 G PO TABS: 1000.0000 mg | ORAL_TABLET | Freq: Three times a day (TID) | ORAL | Status: DC

## 2015-08-24 MED ORDER — ALBUTEROL SULFATE HFA 108 (90 BASE) MCG/ACT IN AERS: 2.0000 | INHALATION_SPRAY | Freq: Four times a day (QID) | RESPIRATORY_TRACT | Status: DC | PRN

## 2015-08-24 NOTE — Patient Instructions (Signed)

## 2015-08-24 NOTE — Addendum Note (Signed)
Addended by: Bennie PieriniMARTIN, MARY-MARGARET on: 08/24/2015 11:49 AM   Modules accepted: Orders

## 2015-08-24 NOTE — Progress Notes (Signed)
   Subjective:    Patient ID: Janice FlockLinda J Holtzer, female    DOB: Jul 29, 1955, 60 y.o.   MRN: [id number]  HPI Patient in today of bumps on private area- they are very painful and burning ad itching. Denies new sexual partner- has never had anything like this before. denies any vaginal discharge.    Review of Systems  Constitutional: Negative.   HENT: Negative.   Respiratory: Negative.   Cardiovascular: Negative.   Genitourinary: Positive for genital sores. Negative for dysuria, urgency and frequency.  Neurological: Negative.   Psychiatric/Behavioral: Negative.   All other systems reviewed and are negative.      Objective:   Physical Exam  Constitutional: She is oriented to person, place, and time. She appears well-developed and well-nourished.  Cardiovascular: Normal rate, regular rhythm and normal heart sounds.   Pulmonary/Chest: Effort normal and breath sounds normal.  Genitourinary:  Dried up scabbed erythematous lesions in patch on right mons  Neurological: She is alert and oriented to person, place, and time.  Skin: Skin is warm.  Psychiatric: She has a normal mood and affect. Her behavior is normal. Judgment and thought content normal.   BP 112/72 mmHg  Pulse 84  Temp(Src) 97 F (36.1 C) (Oral)  Ht 5\' 2"  (1.575 m)  Wt 146 lb (66.225 kg)  BMI 26.70 kg/m2        Assessment & Plan:  Herpes genitalis in women - Plan: valACYclovir (VALTREX) 1000 MG tablet, HSV 1 AND 2 IGM ABS, INDIRECT  Orders Placed This Encounter  Procedures  . HSV 1 AND 2 IGM ABS, INDIRECT   Meds ordered this encounter  Medications  . valACYclovir (VALTREX) 1000 MG tablet    Sig: Take 1 tablet (1,000 mg total) by mouth 3 (three) times daily.    Dispense:  21 tablet    Refill:  0    Order Specific Question:  Supervising Provider    Answer:  Ernestina PennaMOORE, DONALD W [[id number]]   Labs pending for typing NO sex when has outbreak  Bennie PieriniMary-Margaret Taylan Marez, [place]

## 2015-08-25 LAB — HSV 1 AND 2 IGM ABS, INDIRECT
HSV 1 IgM: <1:10 {titer}
HSV 2 IgM: <1:10 {titer}

## 2015-09-05 ENCOUNTER — Ambulatory Visit (INDEPENDENT_AMBULATORY_CARE_PROVIDER_SITE_OTHER): Payer: 59 | Admitting: Family

## 2015-09-05 ENCOUNTER — Encounter: Payer: Self-pay | Admitting: Family

## 2015-09-05 VITALS — BP 100/66 | HR 93 | Temp 97.4°F | Ht 62.0 in | Wt 141.2 lb

## 2015-09-05 DIAGNOSIS — J209 Acute bronchitis, unspecified: Secondary | ICD-10-CM

## 2015-09-05 MED ORDER — AZITHROMYCIN 250 MG PO TABS: ORAL_TABLET | ORAL | Status: DC

## 2015-09-05 MED ORDER — PREDNISONE 10 MG (21) PO TBPK: 10.0000 mg | ORAL_TABLET | Freq: Every day | ORAL | Status: DC

## 2015-09-05 MED ORDER — FLUTICASONE PROPIONATE 50 MCG/ACT NA SUSP
2.0000 | Freq: Every day | NASAL | Status: DC
Start: 2015-09-05 — End: 2016-06-21

## 2015-09-05 NOTE — Progress Notes (Signed)
Subjective:    Patient ID: Dustin FlockLinda J Bordeau, female    DOB: 1955/11/08, 60 y.o.   MRN: [id number]  Asthma She complains of cough, shortness of breath and sputum production. There is no wheezing. This is a chronic problem. Associated symptoms include a fever, postnasal drip and rhinorrhea. Pertinent negatives include no ear congestion, ear pain, headaches, nasal congestion or sore throat. Her past medical history is significant for asthma.  Cough This is a new problem. The current episode started in the past 7 days. The problem has been unchanged. The problem occurs every few minutes. The cough is productive of sputum. Associated symptoms include a fever, postnasal drip, rhinorrhea and shortness of breath. Pertinent negatives include no ear congestion, ear pain, headaches, nasal congestion, sore throat or wheezing. The symptoms are aggravated by lying down. She has tried rest for the symptoms. The treatment provided mild relief. Her past medical history is significant for asthma.      Review of Systems  Constitutional: Positive for fever.  HENT: Positive for postnasal drip and rhinorrhea. Negative for ear pain and sore throat.   Eyes: Negative.   Respiratory: Positive for cough, sputum production and shortness of breath. Negative for wheezing.   Cardiovascular: Negative.  Negative for palpitations.  Gastrointestinal: Negative.   Endocrine: Negative.   Genitourinary: Negative.   Musculoskeletal: Negative.   Neurological: Negative.  Negative for headaches.  Hematological: Negative.   Psychiatric/Behavioral: Negative.   All other systems reviewed and are negative.      Objective:   Physical Exam  Constitutional: She is oriented to person, place, and time. She appears well-developed and well-nourished. No distress.  HENT:  Head: Normocephalic and atraumatic.  Right Ear: External ear normal.  Left Ear: External ear normal.  Mouth/Throat: Oropharynx is clear and moist.  Eyes: Pupils  are equal, round, and reactive to light.  Neck: Normal range of motion. Neck supple. No thyromegaly present.  Cardiovascular: Normal rate, regular rhythm, normal heart sounds and intact distal pulses.   No murmur heard. Pulmonary/Chest: Effort normal. No respiratory distress. She has wheezes.  Productive cough- yellow thick mucus   Abdominal: Soft. Bowel sounds are normal. She exhibits no distension. There is no tenderness.  Musculoskeletal: Normal range of motion. She exhibits no edema or tenderness.  Neurological: She is alert and oriented to person, place, and time.  Skin: Skin is warm and dry.  Psychiatric: She has a normal mood and affect. Her behavior is normal. Judgment and thought content normal.  Vitals reviewed.     BP 100/66 mmHg  Pulse 93  Temp(Src) 97.4 F (36.3 C) (Oral)  Ht 5\' 2"  (1.575 m)  Wt 141 lb 3.2 oz (64.048 kg)  BMI 25.82 kg/m2  SpO2 95%     Assessment & Plan:  1. Acute bronchitis, unspecified organism -- Take meds as prescribed - Use a cool mist humidifier  -Use saline nose sprays frequently -Saline irrigations of the nose can be very helpful if done frequently.  * 4X daily for 1 week*  * Use of a nettie pot can be helpful with this. Follow directions with this* -Force fluids -For any cough or congestion  Use plain Mucinex- regular strength or max strength is fine   * Children- consult with Pharmacist for dosing -For fever or aces or pains- take tylenol or ibuprofen appropriate for age and weight.  * for fevers greater than 101 orally you may alternate ibuprofen and tylenol every  3 hours. -Throat lozenges if help - azithromycin (  ZITHROMAX Z-PAK) 250 MG tablet; Take 500 mg once, then 250 mg for four days  Dispense: 6 each; Refill: 0 - predniSONE (STERAPRED UNI-PAK 21 TAB) 10 MG (21) TBPK tablet; Take 1 tablet (10 mg total) by mouth daily. As directed x 6 days  Dispense: 21 tablet; Refill: 0 - fluticasone (FLONASE) 50 MCG/ACT nasal spray; Place 2  sprays into both nostrils daily.  Dispense: 16 g; Refill: 6  Jannifer Rodney, FNP

## 2015-09-05 NOTE — Patient Instructions (Signed)
Acute Bronchitis Bronchitis is inflammation of the airways that extend from the windpipe into the lungs (bronchi). The inflammation often causes mucus to develop. This leads to a cough, which is the most common symptom of bronchitis.  In acute bronchitis, the condition usually develops suddenly and goes away over time, usually in a couple weeks. Smoking, allergies, and asthma can make bronchitis worse. Repeated episodes of bronchitis may cause further lung problems.  CAUSES Acute bronchitis is most often caused by the same virus that causes a cold. The virus can spread from person to person (contagious) through coughing, sneezing, and touching contaminated objects. SIGNS AND SYMPTOMS   Cough.   Fever.   Coughing up mucus.   Body aches.   Chest congestion.   Chills.   Shortness of breath.   Sore throat.  DIAGNOSIS  Acute bronchitis is usually diagnosed through a physical exam. Your health care provider will also ask you questions about your medical history. Tests, such as chest X-rays, are sometimes done to rule out other conditions.  TREATMENT  Acute bronchitis usually goes away in a couple weeks. Oftentimes, no medical treatment is necessary. Medicines are sometimes given for relief of fever or cough. Antibiotic medicines are usually not needed but may be prescribed in certain situations. In some cases, an inhaler may be recommended to help reduce shortness of breath and control the cough. A cool mist vaporizer may also be used to help thin bronchial secretions and make it easier to clear the chest.  HOME CARE INSTRUCTIONS  Get plenty of rest.   Drink enough fluids to keep your urine clear or pale yellow (unless you have a medical condition that requires fluid restriction). Increasing fluids may help thin your respiratory secretions (sputum) and reduce chest congestion, and it will prevent dehydration.   Take medicines only as directed by your health care provider.  If  you were prescribed an antibiotic medicine, finish it all even if you start to feel better.  Avoid smoking and secondhand smoke. Exposure to cigarette smoke or irritating chemicals will make bronchitis worse. If you are a smoker, consider using nicotine gum or skin patches to help control withdrawal symptoms. Quitting smoking will help your lungs heal faster.   Reduce the chances of another bout of acute bronchitis by washing your hands frequently, avoiding people with cold symptoms, and trying not to touch your hands to your mouth, nose, or eyes.   Keep all follow-up visits as directed by your health care provider.  SEEK MEDICAL CARE IF: Your symptoms do not improve after 1 week of treatment.  SEEK IMMEDIATE MEDICAL CARE IF:  You develop an increased fever or chills.   You have chest pain.   You have severe shortness of breath.  You have bloody sputum.   You develop dehydration.  You faint or repeatedly feel like you are going to pass out.  You develop repeated vomiting.  You develop a severe headache. MAKE SURE YOU:   Understand these instructions.  Will watch your condition.  Will get help right away if you are not doing well or get worse.   This information is not intended to replace advice given to you by your health care provider. Make sure you discuss any questions you have with your health care provider.   Document Released: 04/26/2004 Document Revised: 04/09/2014 Document Reviewed: 09/09/2012 Elsevier Interactive Patient Education 2016 Elsevier Inc.  - Take meds as prescribed - Use a cool mist humidifier  -Use saline nose sprays frequently -Saline   irrigations of the nose can be very helpful if done frequently.  * 4X daily for 1 week*  * Use of a nettie pot can be helpful with this. Follow directions with this* -Force fluids -For any cough or congestion  Use plain Mucinex- regular strength or max strength is fine   * Children- consult with Pharmacist for  dosing -For fever or aces or pains- take tylenol or ibuprofen appropriate for age and weight.  * for fevers greater than 101 orally you may alternate ibuprofen and tylenol every  3 hours. -Throat lozenges if help    Manette Doto, FNP  

## 2015-09-13 ENCOUNTER — Other Ambulatory Visit: Payer: Self-pay | Admitting: Nurse Practitioner

## 2015-09-14 ENCOUNTER — Ambulatory Visit (INDEPENDENT_AMBULATORY_CARE_PROVIDER_SITE_OTHER): Payer: 59 | Admitting: Physician Assistant

## 2015-09-14 ENCOUNTER — Ambulatory Visit (INDEPENDENT_AMBULATORY_CARE_PROVIDER_SITE_OTHER): Payer: 59

## 2015-09-14 ENCOUNTER — Encounter: Payer: Self-pay | Admitting: Physician Assistant

## 2015-09-14 VITALS — BP 103/69 | HR 81 | Temp 98.0°F | Ht 62.0 in | Wt 138.6 lb

## 2015-09-14 DIAGNOSIS — J209 Acute bronchitis, unspecified: Secondary | ICD-10-CM

## 2015-09-14 MED ORDER — ALBUTEROL SULFATE HFA 108 (90 BASE) MCG/ACT IN AERS: 1.0000 | INHALATION_SPRAY | Freq: Four times a day (QID) | RESPIRATORY_TRACT | Status: DC | PRN

## 2015-09-14 MED ORDER — AMOXICILLIN-POT CLAVULANATE 875-125 MG PO TABS: 1.0000 | ORAL_TABLET | Freq: Two times a day (BID) | ORAL | Status: DC

## 2015-09-14 MED ORDER — FLUTICASONE-SALMETEROL 100-50 MCG/DOSE IN AEPB: 1.0000 | INHALATION_SPRAY | Freq: Two times a day (BID) | RESPIRATORY_TRACT | Status: DC

## 2015-09-14 NOTE — Patient Instructions (Signed)

## 2015-09-14 NOTE — Progress Notes (Signed)
Subjective:     Patient ID: Janice FlockLinda J Mullany, female   DOB: 1955-12-18, 60 y.o.   MRN: [id number]  HPI Pt with continued cough , congestion, and wheeze Seen last week for same Feels some what better buts continues with above Finished course of Zpack Prev hx of Advair use but nothing recent Till using Alb 2-3 times per day- needs rf  Review of Systems  Constitutional: Positive for fatigue. Negative for activity change and appetite change.  HENT: Positive for postnasal drip and sinus pressure. Negative for congestion, ear pain, rhinorrhea, sore throat and voice change.   Respiratory: Positive for cough, shortness of breath and wheezing.   Cardiovascular: Negative.        Objective:   Physical Exam  Constitutional: She appears well-developed and well-nourished.  HENT:  Mouth/Throat: Oropharynx is clear and moist. No oropharyngeal exudate.  Neck: Neck supple.  Cardiovascular: Normal rate, regular rhythm and normal heart sounds.   Pulmonary/Chest: Effort normal. No respiratory distress. She has wheezes.  Coarse sounds that clear well with cough  Lymphadenopathy:    She has no cervical adenopathy.  Nursing note and vitals reviewed. CXR-  Change noted in RLL from prev     Assessment:     1. Acute bronchitis, unspecified organism        Plan:     Restart Advair 100/50 1 inh bid and rinse mouth after use RF of Alb Inhaler Augmentin 875mg  bid x 10 days since just on Zpack F/U pending Xray

## 2015-09-15 ENCOUNTER — Telehealth: Payer: Self-pay

## 2015-09-15 MED ORDER — FLUTICASONE FUROATE-VILANTEROL 100-25 MCG/INH IN AEPB: 1.0000 | INHALATION_SPRAY | Freq: Every day | RESPIRATORY_TRACT | Status: DC

## 2015-09-15 NOTE — Telephone Encounter (Signed)
Breo sent over to pharmacy and pt is aware.

## 2015-09-15 NOTE — Telephone Encounter (Signed)
We can try breo 100 mcg, 1 puff daily, but they can all be expensive depending on her copay

## 2015-09-15 NOTE — Telephone Encounter (Signed)
Patient saw Janice Long yesterday and was prescribed Advair, went to pharmacy to pick up and it was $300 with coupon.  Would like to know what else we can prescribe that is less expensive.  Please advise.

## 2015-11-06 ENCOUNTER — Other Ambulatory Visit: Payer: Self-pay | Admitting: Family Medicine

## 2016-02-20 ENCOUNTER — Encounter: Payer: 59 | Admitting: *Deleted

## 2016-06-11 ENCOUNTER — Other Ambulatory Visit: Payer: 59 | Admitting: Nurse Practitioner

## 2016-06-19 ENCOUNTER — Other Ambulatory Visit: Payer: Self-pay | Admitting: Nurse Practitioner

## 2016-06-19 DIAGNOSIS — E785 Hyperlipidemia, unspecified: Secondary | ICD-10-CM

## 2016-06-21 ENCOUNTER — Encounter: Payer: Self-pay | Admitting: Nurse Practitioner

## 2016-06-21 ENCOUNTER — Telehealth: Payer: Self-pay | Admitting: Nurse Practitioner

## 2016-06-21 ENCOUNTER — Ambulatory Visit (INDEPENDENT_AMBULATORY_CARE_PROVIDER_SITE_OTHER): Payer: 59 | Admitting: Nurse Practitioner

## 2016-06-21 VITALS — BP 116/68 | HR 62 | Temp 97.0°F | Ht 62.0 in | Wt 136.0 lb

## 2016-06-21 DIAGNOSIS — Z0000 Encounter for general adult medical examination without abnormal findings: Secondary | ICD-10-CM

## 2016-06-21 DIAGNOSIS — E559 Vitamin D deficiency, unspecified: Secondary | ICD-10-CM

## 2016-06-21 DIAGNOSIS — Z6826 Body mass index (BMI) 26.0-26.9, adult: Secondary | ICD-10-CM

## 2016-06-21 DIAGNOSIS — B079 Viral wart, unspecified: Secondary | ICD-10-CM | POA: Diagnosis not present

## 2016-06-21 DIAGNOSIS — Z01419 Encounter for gynecological examination (general) (routine) without abnormal findings: Secondary | ICD-10-CM

## 2016-06-21 DIAGNOSIS — E785 Hyperlipidemia, unspecified: Secondary | ICD-10-CM

## 2016-06-21 DIAGNOSIS — N904 Leukoplakia of vulva: Secondary | ICD-10-CM

## 2016-06-21 LAB — URINALYSIS, COMPLETE
Bilirubin, UA: NEGATIVE
Glucose, UA: NEGATIVE
KETONES UA: NEGATIVE
Nitrite, UA: NEGATIVE
PROTEIN UA: NEGATIVE
SPEC GRAV UA: 1.025 (ref 1.005–1.030)
Urobilinogen, Ur: 0.2 mg/dL (ref 0.2–1.0)
pH, UA: 5.5 (ref 5.0–7.5)

## 2016-06-21 LAB — MICROSCOPIC EXAMINATION: RENAL EPITHEL UA: NONE SEEN /HPF

## 2016-06-21 MED ORDER — VITAMIN D 1000 UNITS PO TABS: 4000.0000 [IU] | ORAL_TABLET | Freq: Every day | ORAL | 3 refills | Status: DC

## 2016-06-21 MED ORDER — VITAMIN D 1000 UNITS PO TABS: 4000.0000 [IU] | ORAL_TABLET | Freq: Three times a day (TID) | ORAL | 3 refills | Status: DC

## 2016-06-21 MED ORDER — TRIAMCINOLONE ACETONIDE 0.5 % EX CREA: 1.0000 "application " | TOPICAL_CREAM | Freq: Three times a day (TID) | CUTANEOUS | 1 refills | Status: DC

## 2016-06-21 MED ORDER — ATORVASTATIN CALCIUM 20 MG PO TABS: ORAL_TABLET | ORAL | 3 refills | Status: DC

## 2016-06-21 NOTE — Telephone Encounter (Signed)
Spoke with patient need to take 2000iu vitamind daily- do not pick up rx at pharmacy

## 2016-06-21 NOTE — Progress Notes (Signed)
Subjective:    Patient ID: Janice Long, female    DOB: [birth year], 61 y.o.   MRN: [id number]    Patient here today for her annual physical exam and PAP. She is doing well today without complaints.  Outpatient Encounter Prescriptions as of 06/21/2016  Medication Sig  . atorvastatin (LIPITOR) 20 MG tablet TAKE 1 TABLET (20 MG TOTAL) BY MOUTH DAILY.  . cholecalciferol (VITAMIN D) 1000 units tablet Take 4,000 Units by mouth daily.   . Multiple Vitamin (MULTIVITAMIN WITH MINERALS) TABS tablet Take 1 tablet by mouth daily.  Marland Kitchen triamcinolone cream (KENALOG) 0.5 % Apply 1 application topically 3 (three) times daily.  Marland Kitchen albuterol (VENTOLIN HFA) 108 (90 Base) MCG/ACT inhaler Inhale 1-2 puffs into the lungs every 6 (six) hours as needed for wheezing or shortness of breath. (Patient not taking: Reported on 06/21/2016)      Hyperlipidemia  This is a chronic problem. The current episode started more than 1 year ago. The problem is controlled. Pertinent negatives include no chest pain. Current antihyperlipidemic treatment includes statins. The current treatment provides significant improvement of lipids. There are no compliance problems.  Risk factors for coronary artery disease include post-menopausal and dyslipidemia.  Lichen sclerosis of vulva She has some itching but uses kenalog cream on regular basis which helps Vitamin d def Is on vitain d supplement daily- will recheck levels today  * warty lesion left thigh  Review of Systems  Constitutional: Negative.   HENT: Negative.   Eyes: Negative.   Respiratory: Negative.   Cardiovascular: Negative.  Negative for chest pain.  Genitourinary: Negative.   Musculoskeletal: Negative.   Neurological: Negative.   All other systems reviewed and are negative.      Objective:   Physical Exam  Constitutional: She is oriented to person, place, and time. She appears well-developed and well-nourished.  HENT:  Head: Normocephalic.  Right Ear:  Hearing, tympanic membrane, external ear and ear canal normal.  Left Ear: Hearing, tympanic membrane, external ear and ear canal normal.  Nose: Nose normal.  Mouth/Throat: Uvula is midline and oropharynx is clear and moist.  Eyes: Conjunctivae and EOM are normal. Pupils are equal, round, and reactive to light.  Neck: Normal range of motion and full passive range of motion without pain. Neck supple. No JVD present. Carotid bruit is not present. No thyroid mass and no thyromegaly present.  Cardiovascular: Normal rate, normal heart sounds and intact distal pulses.   No murmur heard. Pulmonary/Chest: Effort normal and breath sounds normal. Right breast exhibits no inverted nipple, no mass, no nipple discharge, no skin change and no tenderness. Left breast exhibits no inverted nipple, no mass, no nipple discharge, no skin change and no tenderness.  Abdominal: Soft. Bowel sounds are normal. She exhibits no mass. There is no tenderness.  Genitourinary: Vagina normal and uterus normal. No breast swelling, tenderness, discharge or bleeding.  Genitourinary Comments: bimanual exam-No adnexal masses or tenderness. Cervix parous and pink no discharge Thickened white skin around upper labia bil with slight adhesions- and around annual area  Musculoskeletal: Normal range of motion.  Lymphadenopathy:    She has no cervical adenopathy.  Neurological: She is alert and oriented to person, place, and time.  Skin: Skin is warm and dry.  2cm raised fleshed colored scaly lesion left upper thigh  Psychiatric: She has a normal mood and affect. Her behavior is normal. Judgment and thought content normal.   BP 116/68   Pulse 62   Temp 97 F (36.1  C) (Oral)   Ht '5\' 2"'  (1.575 m)   Wt 136 lb (61.7 kg)   BMI 24.87 kg/m   Procedure- cryotherapy to lesion on left thigh    Assessment & Plan:   1. Annual physical exam - Urinalysis, Complete - CBC with Differential/Platelet - Thyroid Panel With TSH -Microscopic  evaluation  2. Gynecologic exam normal - IGP, Aptima HPV, rfx 16/18,45  3. Lichen sclerosus of female genitalia Use cream as needed to assist with itching - triamcinolone cream (KENALOG) 0.5 %; Apply 1 application topically 3 (three) times daily.  Dispense: 30 g; Refill: 1  4. BMI 26.0-26.9,adult Healthy diet and daily exercise promoted  5. Hyperlipidemia with target LDL less than 100 Low fat diet - CMP14+EGFR - Lipid panel - atorvastatin (LIPITOR) 20 MG tablet; TAKE 1 TABLET (20 MG TOTAL) BY MOUTH DAILY.  Dispense: 90 tablet; Refill: 3  6. Vitamin D deficiency Take medication as prescribed - VITAMIN D 25 Hydroxy (Vit-D Deficiency, Fractures) - cholecalciferol (VITAMIN D) 1000 units tablet; Take 3 tablets (4,000 Units total) by mouth daily.  Dispense: 90 tablet; Refill: 3  7. Viral warts, unspecified type Cryotherapy performed, patient given after care instructions.   Dionisio David, FNP student [place], FNP

## 2016-06-21 NOTE — Patient Instructions (Signed)
Cryotherapy  WHAT IS CRYOTHERAPY?  Cryotherapy, or cold therapy, is a treatment that uses cold temperatures to treat an injury or medical condition. It includes using cold packs or ice packs to reduce pain and swelling. Only use cryotherapy if your doctor says it is okay.  HOW DO I USE CRYOTHERAPY?   Place a towel between the cold source and your skin.   Apply the cold source for no more than 20 minutes at a time.   Check your skin after 5 minutes to make sure there are no signs of a poor response to cold or skin damage. Check for:  ? White spots on your skin. Your skin may look blotchy or mottled.  ? Skin that looks blue or pale.  ? Skin that feels waxy or hard.   Repeat these steps as many times each day as told by your doctor.    HOW CAN I MAKE A COLD PACK?  When using a cold pack at home to reduce pain and swelling, you can use:   A silica gel cold pack that has been left in the freezer. You can buy this online or in stores.   A plastic bag of frozen vegetables.   A sealable plastic bag that has been filled with crushed ice.    Always wrap the pack in a dry or damp towel to avoid direct contact with your skin.  WHEN SHOULD I CALL MY DOCTOR?  Call your doctor if:   You start to have white spots on your skin. This may give your skin a blotchy or mottled look.   Your skin turns blue or pale.   Your skin becomes waxy or hard.   Your swelling gets worse.    This information is not intended to replace advice given to you by your health care provider. Make sure you discuss any questions you have with your health care provider.  Document Released: 09/05/2007 Document Revised: 08/25/2015 Document Reviewed: 12/01/2014  Elsevier Interactive Patient Education  2017 Elsevier Inc[employer]

## 2016-06-22 LAB — LIPID PANEL
Chol/HDL Ratio: 2.9 ratio units (ref 0.0–4.4)
Cholesterol, Total: 246 mg/dL — ABNORMAL HIGH (ref 100–199)
HDL: 85 mg/dL (ref >39)
LDL Calculated: 141 mg/dL — ABNORMAL HIGH (ref 0–99)
Triglycerides: 101 mg/dL (ref 0–149)
VLDL CHOLESTEROL CAL: 20 mg/dL (ref 5–40)

## 2016-06-22 LAB — CMP14+EGFR
A/G RATIO: 1.9 (ref 1.2–2.2)
ALT: 22 IU/L (ref 0–32)
AST: 23 IU/L (ref 0–40)
Albumin: 4.5 g/dL (ref 3.6–4.8)
Alkaline Phosphatase: 65 IU/L (ref 39–117)
BILIRUBIN TOTAL: 0.6 mg/dL (ref 0.0–1.2)
BUN/Creatinine Ratio: 19 (ref 12–28)
BUN: 16 mg/dL (ref 8–27)
CALCIUM: 9.9 mg/dL (ref 8.7–10.3)
CO2: 27 mmol/L (ref 18–29)
CREATININE: 0.85 mg/dL (ref 0.57–1.00)
Chloride: 98 mmol/L (ref 96–106)
GFR calc Af Amer: 86 mL/min/{1.73_m2} (ref >59)
GFR, EST NON AFRICAN AMERICAN: 74 mL/min/{1.73_m2} (ref >59)
GLUCOSE: 81 mg/dL (ref 65–99)
Globulin, Total: 2.4 g/dL (ref 1.5–4.5)
Potassium: 4.6 mmol/L (ref 3.5–5.2)
SODIUM: 141 mmol/L (ref 134–144)
Total Protein: 6.9 g/dL (ref 6.0–8.5)

## 2016-06-22 LAB — CBC WITH DIFFERENTIAL/PLATELET
BASOS: 0 %
Basophils Absolute: 0 10*3/uL (ref 0.0–0.2)
EOS (ABSOLUTE): 0.5 10*3/uL — AB (ref 0.0–0.4)
EOS: 6 %
HEMATOCRIT: 43.3 % (ref 34.0–46.6)
Hemoglobin: 14.5 g/dL (ref 11.1–15.9)
IMMATURE GRANS (ABS): 0 10*3/uL (ref 0.0–0.1)
IMMATURE GRANULOCYTES: 0 %
LYMPHS: 34 %
Lymphocytes Absolute: 2.7 10*3/uL (ref 0.7–3.1)
MCH: 30.1 pg (ref 26.6–33.0)
MCHC: 33.5 g/dL (ref 31.5–35.7)
MCV: 90 fL (ref 79–97)
MONOS ABS: 0.8 10*3/uL (ref 0.1–0.9)
Monocytes: 10 %
NEUTROS PCT: 50 %
Neutrophils Absolute: 3.8 10*3/uL (ref 1.4–7.0)
PLATELETS: 297 10*3/uL (ref 150–379)
RBC: 4.81 x10E6/uL (ref 3.77–5.28)
RDW: 14.6 % (ref 12.3–15.4)
WBC: 7.8 10*3/uL (ref 3.4–10.8)

## 2016-06-22 LAB — VITAMIN D 25 HYDROXY (VIT D DEFICIENCY, FRACTURES): VIT D 25 HYDROXY: 22.2 ng/mL — AB (ref 30.0–100.0)

## 2016-06-22 LAB — THYROID PANEL WITH TSH
FREE THYROXINE INDEX: 1.4 (ref 1.2–4.9)
T3 Uptake Ratio: 22 % — ABNORMAL LOW (ref 24–39)
T4, Total: 6.5 ug/dL (ref 4.5–12.0)
TSH: 1.17 u[IU]/mL (ref 0.450–4.500)

## 2016-06-23 LAB — IGP, APTIMA HPV, RFX 16/18,45
HPV Aptima: NEGATIVE
PAP Smear Comment: 0

## 2017-04-26 DIAGNOSIS — Z1231 Encounter for screening mammogram for malignant neoplasm of breast: Secondary | ICD-10-CM | POA: Diagnosis not present

## 2017-06-25 ENCOUNTER — Ambulatory Visit (INDEPENDENT_AMBULATORY_CARE_PROVIDER_SITE_OTHER): Payer: 59 | Admitting: Nurse Practitioner

## 2017-06-25 ENCOUNTER — Encounter: Payer: Self-pay | Admitting: Nurse Practitioner

## 2017-06-25 ENCOUNTER — Ambulatory Visit (INDEPENDENT_AMBULATORY_CARE_PROVIDER_SITE_OTHER): Payer: 59

## 2017-06-25 VITALS — BP 108/67 | HR 77 | Temp 97.9°F | Ht 62.0 in | Wt 145.0 lb

## 2017-06-25 DIAGNOSIS — M85852 Other specified disorders of bone density and structure, left thigh: Secondary | ICD-10-CM | POA: Diagnosis not present

## 2017-06-25 DIAGNOSIS — E559 Vitamin D deficiency, unspecified: Secondary | ICD-10-CM | POA: Diagnosis not present

## 2017-06-25 DIAGNOSIS — M85851 Other specified disorders of bone density and structure, right thigh: Secondary | ICD-10-CM

## 2017-06-25 DIAGNOSIS — N904 Leukoplakia of vulva: Secondary | ICD-10-CM

## 2017-06-25 DIAGNOSIS — Z6826 Body mass index (BMI) 26.0-26.9, adult: Secondary | ICD-10-CM | POA: Diagnosis not present

## 2017-06-25 DIAGNOSIS — Z13820 Encounter for screening for osteoporosis: Secondary | ICD-10-CM | POA: Diagnosis not present

## 2017-06-25 DIAGNOSIS — E785 Hyperlipidemia, unspecified: Secondary | ICD-10-CM | POA: Diagnosis not present

## 2017-06-25 DIAGNOSIS — Z0000 Encounter for general adult medical examination without abnormal findings: Secondary | ICD-10-CM

## 2017-06-25 DIAGNOSIS — Z780 Asymptomatic menopausal state: Secondary | ICD-10-CM | POA: Diagnosis not present

## 2017-06-25 MED ORDER — ATORVASTATIN CALCIUM 20 MG PO TABS: ORAL_TABLET | ORAL | 3 refills | Status: DC

## 2017-06-25 NOTE — Progress Notes (Signed)
Subjective:    Patient ID: Janice Long, female    DOB: 09-03-1955, 62 y.o.   MRN: [id number]  HPI  Janice Long is here today for annual physical exam ( No PAP ) and  follow up of chronic medical problem.  Outpatient Encounter Medications as of 06/25/2017  Medication Sig  . atorvastatin (LIPITOR) 20 MG tablet TAKE 1 TABLET (20 MG TOTAL) BY MOUTH DAILY.  . Multiple Vitamin (MULTIVITAMIN WITH MINERALS) TABS tablet Take 1 tablet by mouth daily.  Marland Kitchen triamcinolone cream (KENALOG) 0.5 % Apply 1 application topically 3 (three) times daily.     1. Lichen sclerosus of female genitalia  Has bad itching some days an other days it does not bother her.  2. Osteopenia of necks of both femurs  Last dexascan was 08/13/12 with t score of -1.0. Denies back pain  3. BMI 26.0-26.9,adult  No recent weight changes  4. Hyperlipidemia with target LDL less than 100  does not watch diet very well  5. Vitamin D deficiency  Does  take vitamin d daily    New complaints: None today  Social history: Retired the first of this year. Loves it.    Review of Systems  Constitutional: Negative for activity change and appetite change.  HENT: Negative.   Eyes: Negative for pain.  Respiratory: Negative for shortness of breath.   Cardiovascular: Negative for chest pain, palpitations and leg swelling.  Gastrointestinal: Negative for abdominal pain.  Endocrine: Negative for polydipsia.  Genitourinary: Negative.   Skin: Negative for rash.  Neurological: Negative for dizziness, weakness and headaches.  Hematological: Does not bruise/bleed easily.  Psychiatric/Behavioral: Negative.   All other systems reviewed and are negative.      Objective:   Physical Exam  Constitutional: She is oriented to person, place, and time. She appears well-developed and well-nourished.  HENT:  Head: Normocephalic.  Right Ear: Hearing, tympanic membrane, external ear and ear canal normal.  Left Ear: Hearing, tympanic  membrane, external ear and ear canal normal.  Nose: Nose normal.  Mouth/Throat: Uvula is midline and oropharynx is clear and moist.  Eyes: Pupils are equal, round, and reactive to light. Conjunctivae and EOM are normal.  Neck: Normal range of motion and full passive range of motion without pain. Neck supple. No JVD present. Carotid bruit is not present. No thyroid mass and no thyromegaly present.  Cardiovascular: Normal rate, normal heart sounds and intact distal pulses.  No murmur heard. Pulmonary/Chest: Effort normal and breath sounds normal. Right breast exhibits no inverted nipple, no mass, no nipple discharge, no skin change and no tenderness. Left breast exhibits no inverted nipple, no mass, no nipple discharge, no skin change and no tenderness. No breast swelling, tenderness, discharge or bleeding.  Abdominal: Soft. Bowel sounds are normal. She exhibits no mass. There is no tenderness.  Genitourinary: No breast swelling, tenderness, discharge or bleeding.  Genitourinary Comments:    Musculoskeletal: Normal range of motion.  Lymphadenopathy:    She has no cervical adenopathy.  Neurological: She is alert and oriented to person, place, and time.  Skin: Skin is warm and dry.  Psychiatric: She has a normal mood and affect. Her behavior is normal. Judgment and thought content normal.   BP 108/67   Pulse 77   Temp 97.9 F (36.6 C) (Oral)   Ht '5\' 2"'  (1.575 m)   Wt 145 lb (65.8 kg)   BMI 26.52 kg/m      Assessment & Plan:  1. Lichen sclerosus of  female genitalia Continue triamcinolone cream  2. Osteopenia of necks of both femurs Weight besring exercise encourgaed - DG [facility] DEXA  3. BMI 26.0-26.9,adult Discussed diet and exercise for person with BMI >25 Will recheck weight in 3-6 months  4. Hyperlipidemia with target LDL less than 100 Low fta diet - CMP14+EGFR - Lipid panel - atorvastatin (LIPITOR) 20 MG tablet; TAKE 1 TABLET (20 MG TOTAL) BY MOUTH DAILY.  Dispense: 90  tablet; Refill: 3  5. Vitamin D deficiency - VITAMIN D 25 Hydroxy (Vit-D Deficiency, Fractures)  6. Annual physical exam - CBC with Differential/Platelet - Thyroid Panel With TSH    Labs pending Health maintenance reviewed Diet and exercise encouraged Continue all meds Follow up  In 1year    [place], FNP

## 2017-06-25 NOTE — Patient Instructions (Signed)
Bone Health Bones protect organs, store calcium, and anchor muscles. Good health habits, such as eating nutritious foods and exercising regularly, are important for maintaining healthy bones. They can also help to prevent a condition that causes bones to lose density and become weak and brittle (osteoporosis). Why is bone mass important? Bone mass refers to the amount of bone tissue that you have. The higher your bone mass, the stronger your bones. An important step toward having healthy bones throughout life is to have strong and dense bones during childhood. A young adult who has a high bone mass is more likely to have a high bone mass later in life. Bone mass at its greatest it is called peak bone mass. A large decline in bone mass occurs in older adults. In women, it occurs about the time of menopause. During this time, it is important to practice good health habits, because if more bone is lost than what is replaced, the bones will become less healthy and more likely to break (fracture). If you find that you have a low bone mass, you may be able to prevent osteoporosis or further bone loss by changing your diet and lifestyle. How can I find out if my bone mass is low? Bone mass can be measured with an X-ray test that is called a bone mineral density (BMD) test. This test is recommended for all women who are age 65 or older. It may also be recommended for men who are age 70 or older, or for people who are more likely to develop osteoporosis due to:  Having bones that break easily.  Having a long-term disease that weakens bones, such as kidney disease or rheumatoid arthritis.  Having menopause earlier than normal.  Taking medicine that weakens bones, such as steroids, thyroid hormones, or hormone treatment for breast cancer or prostate cancer.  Smoking.  Drinking three or more alcoholic drinks each day.  What are the nutritional recommendations for healthy bones? To have healthy bones, you  need to get enough of the right minerals and vitamins. Most nutrition experts recommend getting these nutrients from the foods that you eat. Nutritional recommendations vary from person to person. Ask your health care provider what is healthy for you. Here are some general guidelines. Calcium Recommendations Calcium is the most important (essential) mineral for bone health. Most people can get enough calcium from their diet, but supplements may be recommended for people who are at risk for osteoporosis. Good sources of calcium include:  Dairy products, such as low-fat or nonfat milk, cheese, and yogurt.  Dark green leafy vegetables, such as bok choy and broccoli.  Calcium-fortified foods, such as orange juice, cereal, bread, soy beverages, and tofu products.  Nuts, such as almonds.  Follow these recommended amounts for daily calcium intake:  Children, age 1?3: 700 mg.  Children, age 4?8: 1,000 mg.  Children, age 9?13: 1,300 mg.  Teens, age 14?18: 1,300 mg.  Adults, age 19?50: 1,000 mg.  Adults, age 51?70: ? Men: 1,000 mg. ? Women: 1,200 mg.  Adults, age 71 or older: 1,200 mg.  Pregnant and breastfeeding females: ? Teens: 1,300 mg. ? Adults: 1,000 mg.  Vitamin D Recommendations Vitamin D is the most essential vitamin for bone health. It helps the body to absorb calcium. Sunlight stimulates the skin to make vitamin D, so be sure to get enough sunlight. If you live in a cold climate or you do not get outside often, your health care provider may recommend that you take vitamin   D supplements. Good sources of vitamin D in your diet include:  Egg yolks.  Saltwater fish.  Milk and cereal fortified with vitamin D.  Follow these recommended amounts for daily vitamin D intake:  Children and teens, age 1?18: 600 international units.  Adults, age 50 or younger: 400-800 international units.  Adults, age 51 or older: 800-1,000 international units.  Other Nutrients Other nutrients  for bone health include:  Phosphorus. This mineral is found in meat, poultry, dairy foods, nuts, and legumes. The recommended daily intake for adult men and adult women is 700 mg.  Magnesium. This mineral is found in seeds, nuts, dark green vegetables, and legumes. The recommended daily intake for adult men is 400?420 mg. For adult women, it is 310?320 mg.  Vitamin K. This vitamin is found in green leafy vegetables. The recommended daily intake is 120 mg for adult men and 90 mg for adult women.  What type of physical activity is best for building and maintaining healthy bones? Weight-bearing and strength-building activities are important for building and maintaining peak bone mass. Weight-bearing activities cause muscles and bones to work against gravity. Strength-building activities increases muscle strength that supports bones. Weight-bearing and muscle-building activities include:  Walking and hiking.  Jogging and running.  Dancing.  Gym exercises.  Lifting weights.  Tennis and racquetball.  Climbing stairs.  Aerobics.  Adults should get at least 30 minutes of moderate physical activity on most days. Children should get at least 60 minutes of moderate physical activity on most days. Ask your health care provide what type of exercise is best for you. Where can I find more information? For more information, check out the following websites:  National Osteoporosis Foundation: http://nof.org/learn/basics  National Institutes of Health: http://www.niams.nih.gov/Health_Info/Bone/Bone_Health/bone_health_for_life.asp  This information is not intended to replace advice given to you by your health care provider. Make sure you discuss any questions you have with your health care provider. Document Released: 06/09/2003 Document Revised: 10/07/2015 Document Reviewed: 03/24/2014 Elsevier Interactive Patient Education  2018 Elsevier Inc.  

## 2017-06-26 LAB — CMP14+EGFR
ALK PHOS: 56 IU/L (ref 39–117)
ALT: 24 IU/L (ref 0–32)
AST: 24 IU/L (ref 0–40)
Albumin/Globulin Ratio: 1.7 (ref 1.2–2.2)
Albumin: 4.5 g/dL (ref 3.6–4.8)
BUN/Creatinine Ratio: 14 (ref 12–28)
BUN: 12 mg/dL (ref 8–27)
Bilirubin Total: 0.6 mg/dL (ref 0.0–1.2)
CO2: 23 mmol/L (ref 20–29)
CREATININE: 0.88 mg/dL (ref 0.57–1.00)
Calcium: 9.7 mg/dL (ref 8.7–10.3)
Chloride: 105 mmol/L (ref 96–106)
GFR calc Af Amer: 81 mL/min/{1.73_m2} (ref >59)
GFR calc non Af Amer: 71 mL/min/{1.73_m2} (ref >59)
GLOBULIN, TOTAL: 2.6 g/dL (ref 1.5–4.5)
GLUCOSE: 92 mg/dL (ref 65–99)
Potassium: 4.6 mmol/L (ref 3.5–5.2)
SODIUM: 142 mmol/L (ref 134–144)
Total Protein: 7.1 g/dL (ref 6.0–8.5)

## 2017-06-26 LAB — LIPID PANEL
CHOLESTEROL TOTAL: 141 mg/dL (ref 100–199)
Chol/HDL Ratio: 2 ratio (ref 0.0–4.4)
HDL: 71 mg/dL (ref >39)
LDL CALC: 56 mg/dL (ref 0–99)
TRIGLYCERIDES: 70 mg/dL (ref 0–149)
VLDL Cholesterol Cal: 14 mg/dL (ref 5–40)

## 2017-06-26 LAB — CBC WITH DIFFERENTIAL/PLATELET
BASOS ABS: 0 10*3/uL (ref 0.0–0.2)
Basos: 0 %
EOS (ABSOLUTE): 0.5 10*3/uL — AB (ref 0.0–0.4)
Eos: 7 %
Hematocrit: 39 % (ref 34.0–46.6)
Hemoglobin: 13 g/dL (ref 11.1–15.9)
Immature Grans (Abs): 0 10*3/uL (ref 0.0–0.1)
Immature Granulocytes: 0 %
LYMPHS ABS: 2.1 10*3/uL (ref 0.7–3.1)
Lymphs: 28 %
MCH: 29.5 pg (ref 26.6–33.0)
MCHC: 33.3 g/dL (ref 31.5–35.7)
MCV: 88 fL (ref 79–97)
MONOS ABS: 0.7 10*3/uL (ref 0.1–0.9)
Monocytes: 9 %
NEUTROS PCT: 56 %
Neutrophils Absolute: 4.2 10*3/uL (ref 1.4–7.0)
PLATELETS: 281 10*3/uL (ref 150–379)
RBC: 4.41 x10E6/uL (ref 3.77–5.28)
RDW: 13.8 % (ref 12.3–15.4)
WBC: 7.6 10*3/uL (ref 3.4–10.8)

## 2017-06-26 LAB — THYROID PANEL WITH TSH
Free Thyroxine Index: 1.7 (ref 1.2–4.9)
T3 UPTAKE RATIO: 24 % (ref 24–39)
T4 TOTAL: 6.9 ug/dL (ref 4.5–12.0)
TSH: 1.65 u[IU]/mL (ref 0.450–4.500)

## 2017-06-26 LAB — VITAMIN D 25 HYDROXY (VIT D DEFICIENCY, FRACTURES): VIT D 25 HYDROXY: 37.9 ng/mL (ref 30.0–100.0)

## 2018-01-15 DIAGNOSIS — Z23 Encounter for immunization: Secondary | ICD-10-CM | POA: Diagnosis not present

## 2018-05-14 DIAGNOSIS — Z20828 Contact with and (suspected) exposure to other viral communicable diseases: Secondary | ICD-10-CM | POA: Diagnosis not present

## 2018-05-14 DIAGNOSIS — J101 Influenza due to other identified influenza virus with other respiratory manifestations: Secondary | ICD-10-CM | POA: Diagnosis not present

## 2018-06-27 ENCOUNTER — Ambulatory Visit (INDEPENDENT_AMBULATORY_CARE_PROVIDER_SITE_OTHER): Payer: 59 | Admitting: Family Medicine

## 2018-06-27 ENCOUNTER — Encounter: Payer: Self-pay | Admitting: Family Medicine

## 2018-06-27 ENCOUNTER — Telehealth: Payer: Self-pay | Admitting: Nurse Practitioner

## 2018-06-27 ENCOUNTER — Other Ambulatory Visit: Payer: Self-pay

## 2018-06-27 DIAGNOSIS — J4520 Mild intermittent asthma, uncomplicated: Secondary | ICD-10-CM

## 2018-06-27 MED ORDER — ALBUTEROL SULFATE HFA 108 (90 BASE) MCG/ACT IN AERS: 1.0000 | INHALATION_SPRAY | Freq: Four times a day (QID) | RESPIRATORY_TRACT | 3 refills | Status: DC | PRN

## 2018-06-27 NOTE — Telephone Encounter (Signed)
Due to pt not being seen in the last year RX could not be refilled without appt Telephone appt scheduled

## 2018-06-27 NOTE — Progress Notes (Signed)
Called in for prescription refill. Sent to me since it expired 1 yr & 1 day ago. Pt totally free of symptoms. Scrip called in. Office visit canceled.

## 2018-06-27 NOTE — Telephone Encounter (Signed)
Pt is calling again is aware mmm is out wants this taken care of today. Please advise.

## 2018-07-09 ENCOUNTER — Other Ambulatory Visit: Payer: Self-pay | Admitting: Nurse Practitioner

## 2018-07-09 DIAGNOSIS — E785 Hyperlipidemia, unspecified: Secondary | ICD-10-CM

## 2018-07-10 ENCOUNTER — Ambulatory Visit (INDEPENDENT_AMBULATORY_CARE_PROVIDER_SITE_OTHER): Payer: 59 | Admitting: Nurse Practitioner

## 2018-07-10 ENCOUNTER — Other Ambulatory Visit: Payer: Self-pay

## 2018-07-10 ENCOUNTER — Encounter: Payer: Self-pay | Admitting: Nurse Practitioner

## 2018-07-10 DIAGNOSIS — M85851 Other specified disorders of bone density and structure, right thigh: Secondary | ICD-10-CM | POA: Diagnosis not present

## 2018-07-10 DIAGNOSIS — E785 Hyperlipidemia, unspecified: Secondary | ICD-10-CM | POA: Diagnosis not present

## 2018-07-10 DIAGNOSIS — Z6826 Body mass index (BMI) 26.0-26.9, adult: Secondary | ICD-10-CM

## 2018-07-10 DIAGNOSIS — E559 Vitamin D deficiency, unspecified: Secondary | ICD-10-CM

## 2018-07-10 DIAGNOSIS — N904 Leukoplakia of vulva: Secondary | ICD-10-CM | POA: Diagnosis not present

## 2018-07-10 DIAGNOSIS — M85852 Other specified disorders of bone density and structure, left thigh: Secondary | ICD-10-CM

## 2018-07-10 MED ORDER — ATORVASTATIN CALCIUM 20 MG PO TABS: ORAL_TABLET | ORAL | 3 refills | Status: DC

## 2018-07-10 NOTE — Progress Notes (Signed)
Patient ID: Janice Long, female   DOB: 26-Apr-1955, 63 y.o.   MRN: [id number]    Virtual Visit via telephone Note  I connected with Janice Long on 07/10/18 at 11:20 AM by telephone and verified that I am speaking with the correct person using two identifiers. Janice Long is currently located at home and her husband is currently with her during visit. The provider, [name], FNP is located in their office at time of visit.  I discussed the limitations, risks, security and privacy concerns of performing an evaluation and management service by telephone and the availability of in person appointments. I also discussed with the patient that there may be a patient responsible charge related to this service. The patient expressed understanding and agreed to proceed.   History and Present Illness:   Chief Complaint: medical management of chronic issues  HPI:  1. Hyperlipidemia with target LDL less than 100  Does not watch diet and does very little exercise. Was doing zumba until this covid virus.  2. Osteopenia of necks of both femurs  Last dexascan was done on 06/25/17 with t score of -0.5  3. Vitamin D deficiency  Takes daily vitamin d supplement  4. Lichen sclerosus of female genitalia  Uses triamcinolone cream to prevent itching. Does not use everyday.  5. BMI 26.0-26.9,adult  no recent weight changes    Outpatient Encounter Medications as of 07/10/2018  Medication Sig  . albuterol (VENTOLIN HFA) 108 (90 Base) MCG/ACT inhaler Inhale 1-2 puffs into the lungs every 6 (six) hours as needed for wheezing or shortness of breath.  Marland Kitchen atorvastatin (LIPITOR) 20 MG tablet TAKE 1 TABLET (20 MG TOTAL) BY MOUTH DAILY.  . Multiple Vitamin (MULTIVITAMIN WITH MINERALS) TABS tablet Take 1 tablet by mouth daily.  Marland Kitchen triamcinolone cream (KENALOG) 0.5 % Apply 1 application topically 3 (three) times daily.      New complaints: None today  Social history:  Has been retired since  January. Has been enjoying it.    Review of Systems  Constitutional: Negative for diaphoresis and weight loss.  Eyes: Negative for blurred vision, double vision and pain.  Respiratory: Negative for shortness of breath.   Cardiovascular: Negative for chest pain, palpitations, orthopnea and leg swelling.  Gastrointestinal: Negative for abdominal pain.  Skin: Negative for rash.  Neurological: Negative for dizziness, sensory change, loss of consciousness, weakness and headaches.  Endo/Heme/Allergies: Negative for polydipsia. Does not bruise/bleed easily.  Psychiatric/Behavioral: Negative for memory loss. The patient does not have insomnia.   All other systems reviewed and are negative.    Observations/Objective: Alert and oriented- answers all questions approrpriately  Assessment and Plan: Janice Long comes in today with chief complaint of No chief complaint on file.   Diagnosis and orders addressed:  1. Hyperlipidemia with target LDL less than 100 Low fat diet - atorvastatin (LIPITOR) 20 MG tablet; TAKE 1 TABLET (20 MG TOTAL) BY MOUTH DAILY.  Dispense: 90 tablet; Refill: 3  2. Osteopenia of necks of both femurs Weight bearing exercises  3. Vitamin D deficiency Continue daily vitamin d supplment  4. Lichen sclerosus of female genitalia Continue triamcinolone cream Avoid hot baths and harsh soaps  5. BMI 26.0-26.9,adult Discussed diet and exercise for person with BMI >25 Will recheck weight in 3-6 months   Previous labs reviewed Health Maintenance reviewed Diet and exercise encouraged  Follow up plan: 3 months     I discussed the assessment and treatment plan with the patient. The patient  was provided an opportunity to ask questions and all were answered. The patient agreed with the plan and demonstrated an understanding of the instructions.   The patient was advised to call back or seek an in-person evaluation if the symptoms worsen or if the condition fails  to improve as anticipated.  The above assessment and management plan was discussed with the patient. The patient verbalized understanding of and has agreed to the management plan. Patient is aware to call the clinic if symptoms persist or worsen. Patient is aware when to return to the clinic for a follow-up visit. Patient educated on when it is appropriate to go to the emergency department.    I provided 12 minutes of non-face-to-face time during this encounter.    Mary-Margaret Daphine DeutscherMartin, FNP

## 2018-08-28 ENCOUNTER — Other Ambulatory Visit: Payer: Self-pay

## 2018-08-28 ENCOUNTER — Encounter: Payer: Self-pay | Admitting: Family Medicine

## 2018-08-28 ENCOUNTER — Ambulatory Visit (INDEPENDENT_AMBULATORY_CARE_PROVIDER_SITE_OTHER): Payer: 59 | Admitting: Family Medicine

## 2018-08-28 VITALS — BP 106/67 | HR 83 | Temp 99.0°F | Ht 62.0 in | Wt 146.0 lb

## 2018-08-28 DIAGNOSIS — L02214 Cutaneous abscess of groin: Secondary | ICD-10-CM

## 2018-08-28 MED ORDER — SULFAMETHOXAZOLE-TRIMETHOPRIM 800-160 MG PO TABS: 1.0000 | ORAL_TABLET | Freq: Two times a day (BID) | ORAL | 0 refills | Status: AC

## 2018-08-28 NOTE — Patient Instructions (Signed)

## 2018-08-28 NOTE — Progress Notes (Signed)
Subjective:  Patient ID: Janice Long, female    DOB: January 26, 1956, 63 y.o.   MRN: [id number]  Chief Complaint:  Raised place in groin area (draining this AM)   HPI: Janice Long is a 63 y.o. female presenting on 08/28/2018 for Raised place in groin area (draining this AM)   1. Abscess of groin, right   Pt presents today with a draining abscess to her right groin. She states this was a hard cyst for several months. States over the last few days it has become tender to touch and started draining. She denies fever, chills, weakness, or confusion. No known injury.    Relevant past medical, surgical, family, and social history reviewed and updated as indicated.  Allergies and medications reviewed and updated.   Past Medical History:  Diagnosis Date  . Hyperlipidemia     Past Surgical History:  Procedure Laterality Date  . TUBAL LIGATION      Social History   Socioeconomic History  . Marital status: Married    Spouse name: Not on file  . Number of children: Not on file  . Years of education: Not on file  . Highest education level: Not on file  Occupational History  . Not on file  Social Needs  . Financial resource strain: Not on file  . Food insecurity:    Worry: Not on file    Inability: Not on file  . Transportation needs:    Medical: Not on file    Non-medical: Not on file  Tobacco Use  . Smoking status: Never Smoker  . Smokeless tobacco: Never Used  Substance and Sexual Activity  . Alcohol use: Yes  . Drug use: No  . Sexual activity: Not on file  Lifestyle  . Physical activity:    Days per week: Not on file    Minutes per session: Not on file  . Stress: Not on file  Relationships  . Social connections:    Talks on phone: Not on file    Gets together: Not on file    Attends religious service: Not on file    Active member of club or organization: Not on file    Attends meetings of clubs or organizations: Not on file    Relationship status: Not on  file  . Intimate partner violence:    Fear of current or ex partner: Not on file    Emotionally abused: Not on file    Physically abused: Not on file    Forced sexual activity: Not on file  Other Topics Concern  . Not on file  Social History Narrative  . Not on file    Outpatient Encounter Medications as of 08/28/2018  Medication Sig  . albuterol (VENTOLIN HFA) 108 (90 Base) MCG/ACT inhaler Inhale 1-2 puffs into the lungs every 6 (six) hours as needed for wheezing or shortness of breath.  Marland Kitchen atorvastatin (LIPITOR) 20 MG tablet TAKE 1 TABLET (20 MG TOTAL) BY MOUTH DAILY.  . Multiple Vitamin (MULTIVITAMIN WITH MINERALS) TABS tablet Take 1 tablet by mouth daily.  Marland Kitchen triamcinolone cream (KENALOG) 0.5 % Apply 1 application topically 3 (three) times daily.  Marland Kitchen sulfamethoxazole-trimethoprim (BACTRIM DS) 800-160 MG tablet Take 1 tablet by mouth 2 (two) times daily for 10 days.   No facility-administered encounter medications on file as of 08/28/2018.     No Known Allergies  Review of Systems  Constitutional: Negative for chills, diaphoresis, fatigue and fever.  Respiratory: Negative for cough and shortness  of breath.   Cardiovascular: Negative for chest pain, palpitations and leg swelling.  Skin: Positive for rash and wound.  Neurological: Negative for dizziness, syncope, weakness, light-headedness and headaches.  Psychiatric/Behavioral: Negative for confusion.  All other systems reviewed and are negative.       Objective:  BP 106/67   Pulse 83   Temp 99 F (37.2 C) (Oral)   Ht _0  (1.575 m)   Wt 146 lb (66.2 kg)   BMI 26.70 kg/m    Wt Readings from Last 3 Encounters:  08/28/18 146 lb (66.2 kg)  06/25/17 145 lb (65.8 kg)  06/21/16 136 lb (61.7 kg)    Physical Exam Vitals signs and nursing note reviewed.  Constitutional:      General: She is not in acute distress.    Appearance: Normal appearance. She is well-developed, well-groomed and overweight. She is not  ill-appearing or toxic-appearing.  HENT:     Head: Normocephalic and atraumatic.     Mouth/Throat:     Mouth: Mucous membranes are moist.     Pharynx: Oropharynx is clear.  Eyes:     Conjunctiva/sclera: Conjunctivae normal.     Pupils: Pupils are equal, round, and reactive to light.  Cardiovascular:     Rate and Rhythm: Normal rate and regular rhythm.     Heart sounds: Normal heart sounds. No murmur. No friction rub. No gallop.   Pulmonary:     Effort: Pulmonary effort is normal. No respiratory distress.     Breath sounds: Normal breath sounds.  Abdominal:     General: Bowel sounds are normal. There is no distension.     Palpations: Abdomen is soft.     Tenderness: There is no abdominal tenderness.  Skin:    General: Skin is warm and dry.     Capillary Refill: Capillary refill takes less than 2 seconds.     Findings: Lesion and rash present. Rash is nodular.       Neurological:     General: No focal deficit present.     Mental Status: She is alert and oriented to person, place, and time.  Psychiatric:        Mood and Affect: Mood normal.        Behavior: Behavior normal. Behavior is cooperative.        Thought Content: Thought content normal.        Judgment: Judgment normal.     Results for orders placed or performed in visit on 06/25/17  CBC with Differential/Platelet  Result Value Ref Range   WBC 7.6 3.4 - 10.8 x10E3/uL   RBC 4.41 3.77 - 5.28 x10E6/uL   Hemoglobin 13.0 11.1 - 15.9 g/dL   Hematocrit 39.0 34.0 - 46.6 %   MCV 88 79 - 97 fL   MCH 29.5 26.6 - 33.0 pg   MCHC 33.3 31.5 - 35.7 g/dL   RDW 13.8 12.3 - 15.4 %   Platelets 281 150 - 379 x10E3/uL   Neutrophils 56 Not Estab. %   Lymphs 28 Not Estab. %   Monocytes 9 Not Estab. %   Eos 7 Not Estab. %   Basos 0 Not Estab. %   Neutrophils Absolute 4.2 1.4 - 7.0 x10E3/uL   Lymphocytes Absolute 2.1 0.7 - 3.1 x10E3/uL   Monocytes Absolute 0.7 0.1 - 0.9 x10E3/uL   EOS (ABSOLUTE) 0.5 (H) 0.0 - 0.4 x10E3/uL    Basophils Absolute 0.0 0.0 - 0.2 x10E3/uL   Immature Granulocytes 0 Not Estab. %  Immature Grans (Abs) 0.0 0.0 - 0.1 x10E3/uL  CMP14+EGFR  Result Value Ref Range   Glucose 92 65 - 99 mg/dL   BUN 12 8 - 27 mg/dL   Creatinine, Ser 0.88 0.57 - 1.00 mg/dL   GFR calc non Af Amer 71 >59 mL/min/1.73   GFR calc Af Amer 81 >59 mL/min/1.73   BUN/Creatinine Ratio 14 12 - 28   Sodium 142 134 - 144 mmol/L   Potassium 4.6 3.5 - 5.2 mmol/L   Chloride 105 96 - 106 mmol/L   CO2 23 20 - 29 mmol/L   Calcium 9.7 8.7 - 10.3 mg/dL   Total Protein 7.1 6.0 - 8.5 g/dL   Albumin 4.5 3.6 - 4.8 g/dL   Globulin, Total 2.6 1.5 - 4.5 g/dL   Albumin/Globulin Ratio 1.7 1.2 - 2.2   Bilirubin Total 0.6 0.0 - 1.2 mg/dL   Alkaline Phosphatase 56 39 - 117 IU/L   AST 24 0 - 40 IU/L   ALT 24 0 - 32 IU/L  Lipid panel  Result Value Ref Range   Cholesterol, Total 141 100 - 199 mg/dL   Triglycerides 70 0 - 149 mg/dL   HDL 71 >39 mg/dL   VLDL Cholesterol Cal 14 5 - 40 mg/dL   LDL Calculated 56 0 - 99 mg/dL   Chol/HDL Ratio 2.0 0.0 - 4.4 ratio  Thyroid Panel With TSH  Result Value Ref Range   TSH 1.650 0.450 - 4.500 uIU/mL   T4, Total 6.9 4.5 - 12.0 ug/dL   T3 Uptake Ratio 24 24 - 39 %   Free Thyroxine Index 1.7 1.2 - 4.9  VITAMIN D 25 Hydroxy (Vit-D Deficiency, Fractures)  Result Value Ref Range   Vit D, 25-Hydroxy 37.9 30.0 - 100.0 ng/mL       Pertinent labs & imaging results that were available during my care of the patient were reviewed by me and considered in my medical decision making.  Assessment & Plan:  Savanha was seen today for raised place in groin area.  Diagnoses and all orders for this visit:  Abscess of groin, right Abscess open and draining. Warm compresses to facilitate draining. Can do sitz baths several times per day if able. Medications as prescribed. Report any new or worsening symptoms.  -     sulfamethoxazole-trimethoprim (BACTRIM DS) 800-160 MG tablet; Take 1 tablet by mouth 2 (two)  times daily for 10 days.     Continue all other maintenance medications.  Follow up plan: Return in about 2 weeks (around 09/11/2018), or if symptoms worsen or fail to improve, for abscess.  Educational handout given for abscess   The above assessment and management plan was discussed with the patient. The patient verbalized understanding of and has agreed to the management plan. Patient is aware to call the clinic if symptoms persist or worsen. Patient is aware when to return to the clinic for a follow-up visit. Patient educated on when it is appropriate to go to the emergency department.   Monia Pouch, FNP-C [facility] [phone]

## 2019-03-03 ENCOUNTER — Other Ambulatory Visit: Payer: Self-pay

## 2019-03-04 ENCOUNTER — Ambulatory Visit (INDEPENDENT_AMBULATORY_CARE_PROVIDER_SITE_OTHER): Payer: 59

## 2019-03-04 DIAGNOSIS — Z23 Encounter for immunization: Secondary | ICD-10-CM

## 2019-07-27 ENCOUNTER — Other Ambulatory Visit: Payer: Self-pay | Admitting: Nurse Practitioner

## 2019-07-27 DIAGNOSIS — E785 Hyperlipidemia, unspecified: Secondary | ICD-10-CM

## 2019-08-20 ENCOUNTER — Other Ambulatory Visit: Payer: Self-pay | Admitting: Nurse Practitioner

## 2019-08-20 DIAGNOSIS — E785 Hyperlipidemia, unspecified: Secondary | ICD-10-CM

## 2019-08-20 MED ORDER — ATORVASTATIN CALCIUM 20 MG PO TABS
ORAL_TABLET | ORAL | 1 refills | Status: DC
Start: 2019-08-20 — End: 2019-09-28

## 2019-08-20 NOTE — Telephone Encounter (Signed)
MMM NTBS 30 days given 07/27/19 

## 2019-08-20 NOTE — Telephone Encounter (Signed)
Appt made

## 2019-08-20 NOTE — Addendum Note (Signed)
Addended by: Magdalene River on: 08/20/2019 04:17 PM   Modules accepted: Orders

## 2019-09-18 ENCOUNTER — Other Ambulatory Visit: Payer: Self-pay

## 2019-09-18 ENCOUNTER — Other Ambulatory Visit: Payer: 59

## 2019-09-18 DIAGNOSIS — E785 Hyperlipidemia, unspecified: Secondary | ICD-10-CM

## 2019-09-18 DIAGNOSIS — Z1329 Encounter for screening for other suspected endocrine disorder: Secondary | ICD-10-CM

## 2019-09-18 DIAGNOSIS — E559 Vitamin D deficiency, unspecified: Secondary | ICD-10-CM

## 2019-09-19 LAB — CBC WITH DIFFERENTIAL/PLATELET
Basophils Absolute: 0.1 10*3/uL (ref 0.0–0.2)
Basos: 1 %
EOS (ABSOLUTE): 0.9 10*3/uL — ABNORMAL HIGH (ref 0.0–0.4)
Eos: 12 %
Hematocrit: 42.5 % (ref 34.0–46.6)
Hemoglobin: 14.1 g/dL (ref 11.1–15.9)
Immature Grans (Abs): 0 10*3/uL (ref 0.0–0.1)
Immature Granulocytes: 0 %
Lymphocytes Absolute: 2.7 10*3/uL (ref 0.7–3.1)
Lymphs: 34 %
MCH: 30.1 pg (ref 26.6–33.0)
MCHC: 33.2 g/dL (ref 31.5–35.7)
MCV: 91 fL (ref 79–97)
Monocytes Absolute: 0.8 10*3/uL (ref 0.1–0.9)
Monocytes: 10 %
Neutrophils Absolute: 3.4 10*3/uL (ref 1.4–7.0)
Neutrophils: 43 %
Platelets: 267 10*3/uL (ref 150–450)
RBC: 4.68 x10E6/uL (ref 3.77–5.28)
RDW: 12.7 % (ref 11.7–15.4)
WBC: 7.9 10*3/uL (ref 3.4–10.8)

## 2019-09-19 LAB — CMP14+EGFR
ALT: 28 IU/L (ref 0–32)
AST: 34 IU/L (ref 0–40)
Albumin/Globulin Ratio: 1.6 (ref 1.2–2.2)
Albumin: 4.1 g/dL (ref 3.8–4.8)
Alkaline Phosphatase: 55 IU/L (ref 48–121)
BUN/Creatinine Ratio: 12 (ref 12–28)
BUN: 11 mg/dL (ref 8–27)
Bilirubin Total: 0.8 mg/dL (ref 0.0–1.2)
CO2: 23 mmol/L (ref 20–29)
Calcium: 9.6 mg/dL (ref 8.7–10.3)
Chloride: 102 mmol/L (ref 96–106)
Creatinine, Ser: 0.93 mg/dL (ref 0.57–1.00)
GFR calc Af Amer: 75 mL/min/{1.73_m2} (ref >59)
GFR calc non Af Amer: 65 mL/min/{1.73_m2} (ref >59)
Globulin, Total: 2.5 g/dL (ref 1.5–4.5)
Glucose: 90 mg/dL (ref 65–99)
Potassium: 4.2 mmol/L (ref 3.5–5.2)
Sodium: 140 mmol/L (ref 134–144)
Total Protein: 6.6 g/dL (ref 6.0–8.5)

## 2019-09-19 LAB — LIPID PANEL
Chol/HDL Ratio: 2.4 ratio (ref 0.0–4.4)
Cholesterol, Total: 145 mg/dL (ref 100–199)
HDL: 60 mg/dL (ref >39)
LDL Chol Calc (NIH): 70 mg/dL (ref 0–99)
Triglycerides: 75 mg/dL (ref 0–149)
VLDL Cholesterol Cal: 15 mg/dL (ref 5–40)

## 2019-09-19 LAB — VITAMIN D 25 HYDROXY (VIT D DEFICIENCY, FRACTURES): Vit D, 25-Hydroxy: 39.1 ng/mL (ref 30.0–100.0)

## 2019-09-19 LAB — THYROID PANEL WITH TSH
Free Thyroxine Index: 2.1 (ref 1.2–4.9)
T3 Uptake Ratio: 25 % (ref 24–39)
T4, Total: 8.2 ug/dL (ref 4.5–12.0)
TSH: 2.88 u[IU]/mL (ref 0.450–4.500)

## 2019-09-28 ENCOUNTER — Ambulatory Visit (INDEPENDENT_AMBULATORY_CARE_PROVIDER_SITE_OTHER): Payer: 59

## 2019-09-28 ENCOUNTER — Ambulatory Visit (INDEPENDENT_AMBULATORY_CARE_PROVIDER_SITE_OTHER): Payer: 59 | Admitting: Nurse Practitioner

## 2019-09-28 ENCOUNTER — Other Ambulatory Visit: Payer: Self-pay

## 2019-09-28 ENCOUNTER — Encounter: Payer: Self-pay | Admitting: Nurse Practitioner

## 2019-09-28 ENCOUNTER — Other Ambulatory Visit (HOSPITAL_COMMUNITY)
Admission: RE | Admit: 2019-09-28 | Discharge: 2019-09-28 | Disposition: A | Discharge status: Home / Self Care | Payer: 59 | Source: Ambulatory Visit | Attending: Nurse Practitioner | Admitting: Nurse Practitioner

## 2019-09-28 VITALS — BP 105/67 | HR 74 | Temp 97.8°F | Resp 20 | Ht 62.0 in | Wt 141.0 lb

## 2019-09-28 DIAGNOSIS — Z6825 Body mass index (BMI) 25.0-25.9, adult: Secondary | ICD-10-CM

## 2019-09-28 DIAGNOSIS — N904 Leukoplakia of vulva: Secondary | ICD-10-CM

## 2019-09-28 DIAGNOSIS — Z0001 Encounter for general adult medical examination with abnormal findings: Secondary | ICD-10-CM | POA: Diagnosis not present

## 2019-09-28 DIAGNOSIS — M85851 Other specified disorders of bone density and structure, right thigh: Secondary | ICD-10-CM

## 2019-09-28 DIAGNOSIS — E785 Hyperlipidemia, unspecified: Secondary | ICD-10-CM

## 2019-09-28 DIAGNOSIS — Z0000 Encounter for general adult medical examination without abnormal findings: Secondary | ICD-10-CM

## 2019-09-28 DIAGNOSIS — M85852 Other specified disorders of bone density and structure, left thigh: Secondary | ICD-10-CM

## 2019-09-28 DIAGNOSIS — E559 Vitamin D deficiency, unspecified: Secondary | ICD-10-CM

## 2019-09-28 LAB — URINALYSIS, COMPLETE
Bilirubin, UA: NEGATIVE
Glucose, UA: NEGATIVE
Ketones, UA: NEGATIVE
Leukocytes,UA: NEGATIVE
Nitrite, UA: NEGATIVE
Protein,UA: NEGATIVE
RBC, UA: NEGATIVE
Specific Gravity, UA: 1.025 (ref 1.005–1.030)
Urobilinogen, Ur: 0.2 mg/dL (ref 0.2–1.0)
pH, UA: 7 (ref 5.0–7.5)

## 2019-09-28 LAB — MICROSCOPIC EXAMINATION
RBC, Urine: NONE SEEN /hpf (ref 0–2)
Renal Epithel, UA: NONE SEEN /hpf
WBC, UA: NONE SEEN /hpf (ref 0–5)

## 2019-09-28 MED ORDER — ATORVASTATIN CALCIUM 20 MG PO TABS: ORAL_TABLET | ORAL | 1 refills | Status: DC

## 2019-09-28 NOTE — Addendum Note (Signed)
Addended by: Cleda Daub on: 09/28/2019 12:39 PM   Modules accepted: Orders

## 2019-09-28 NOTE — Progress Notes (Signed)
Subjective:    Patient ID: Janice Long, female    DOB: 01-12-1956, 64 y.o.   MRN: [id number]   Chief Complaint: Annual Exam    HPI:  1. Annual physical exam patient needs pap today as well.  2. Hyperlipidemia with target LDL less than 100 Tries to watch diet. Does some walking several times a week. Lab Results  Component Value Date   CHOL 145 09/18/2019   HDL 60 09/18/2019   LDLCALC 70 09/18/2019   TRIG 75 09/18/2019   CHOLHDL 2.4 09/18/2019     3. Lichen sclerosus of female genitalia Some itching. The steroid creams help  4. Vitamin D deficiency Takes a daily vitamin d  supplement  5. Osteopenia of necks of both femurs Last bone density teat was done 06/25/17. T score was -1.3  6. BMI 26.0-26.9,adult weight is down 5lbs from previous visit Wt Readings from Last 3 Encounters:  09/28/19 141 lb (64 kg)  08/28/18 146 lb (66.2 kg)  06/25/17 145 lb (65.8 kg)   BMI Readings from Last 3 Encounters:  09/28/19 25.79 kg/m  08/28/18 26.70 kg/m  06/25/17 26.52 kg/m       Outpatient Encounter Medications as of 09/28/2019  Medication Sig  . albuterol (VENTOLIN HFA) 108 (90 Base) MCG/ACT inhaler Inhale 1-2 puffs into the lungs every 6 (six) hours as needed for wheezing or shortness of breath.  Marland Kitchen atorvastatin (LIPITOR) 20 MG tablet TAKE 1 TABLET BY MOUTH EVERY DAY.  Needs to be seen for further refills.  . Multiple Vitamin (MULTIVITAMIN WITH MINERALS) TABS tablet Take 1 tablet by mouth daily.  Marland Kitchen triamcinolone cream (KENALOG) 0.5 % Apply 1 application topically 3 (three) times daily.     Past Surgical History:  Procedure Laterality Date  . TUBAL LIGATION      Family History  Problem Relation Age of Onset  . Cancer Mother        cervical, bladder, tumor on urethra  . Diabetes Mother   . Hyperlipidemia Father   . Dementia Father     New complaints: None today  Social history: Lives with husband and dog. Retired from [employer]  substance contract: n/a    Review of Systems  Constitutional: Negative for diaphoresis.  Eyes: Negative for pain.  Respiratory: Negative for shortness of breath.   Cardiovascular: Negative for chest pain, palpitations and leg swelling.  Gastrointestinal: Negative for abdominal pain.  Endocrine: Negative for polydipsia.  Skin: Negative for rash.  Neurological: Negative for dizziness, weakness and headaches.  Hematological: Does not bruise/bleed easily.  All other systems reviewed and are negative.      Objective:   Physical Exam Vitals and nursing note reviewed.  Constitutional:      General: She is not in acute distress.    Appearance: Normal appearance. She is well-developed.  HENT:     Head: Normocephalic.     Nose: Nose normal.  Eyes:     Pupils: Pupils are equal, round, and reactive to light.  Neck:     Vascular: No carotid bruit or JVD.  Cardiovascular:     Rate and Rhythm: Normal rate and regular rhythm.     Heart sounds: Normal heart sounds.  Pulmonary:     Effort: Pulmonary effort is normal. No respiratory distress.     Breath sounds: Normal breath sounds. No wheezing or rales.  Chest:     Chest wall: No tenderness.  Abdominal:     General: Bowel sounds are normal. There is  no distension or abdominal bruit.     Palpations: Abdomen is soft. There is no hepatomegaly, splenomegaly, mass or pulsatile mass.     Tenderness: There is no abdominal tenderness.  Genitourinary:    General: Normal vulva.     Rectum: Normal.     Comments: Cervix parous and pink No adnexal masses or tenderness Large cystocele and small rectocele Musculoskeletal:        General: Normal range of motion.     Cervical back: Normal range of motion and neck supple.  Lymphadenopathy:     Cervical: No cervical adenopathy.  Skin:    General: Skin is warm and dry.  Neurological:     Mental Status: She is alert and oriented to person, place, and time.     Deep Tendon Reflexes: Reflexes are  normal and symmetric.  Psychiatric:        Behavior: Behavior normal.        Thought Content: Thought content normal.        Judgment: Judgment normal.     BP 105/67   Pulse 74   Temp 97.8 F (36.6 C) (Temporal)   Resp 20   Ht '5\' 2"'  (1.575 m)   Wt 141 lb (64 kg)   SpO2 98%   BMI 25.79 kg/m   Ekg- NSR- Mary-Margaret Hassell Done, FNP  Chest xray- no acute or chronic cardiopulmonary findings-Preliminary reading by Ronnald Collum, FNP  [facility] [place]      Assessment & Plan:  Janice Long comes in today with chief complaint of Annual Exam   Diagnosis and orders addressed:  1. Annual physical exam - CBC with Differential/Platelet - Thyroid Panel With TSH - Cytology - PAP  2. Hyperlipidemia with target LDL less than 100 Low fat diet  - CMP14+EGFR - Lipid panel - atorvastatin (LIPITOR) 20 MG tablet; TAKE 1 TABLET BY MOUTH EVERY DAY.  Needs to be seen for further refills.  Dispense: 30 tablet; Refill: 1  3. Lichen sclerosus of female genitalia Avoid hot baths or showers Only use dove soap  4. Vitamin D deficiency Continue daily vitamin d supplement  5. Osteopenia of necks of both femurs Weight bearing exercises encourgaed  6. BMI 25.0-25.9,adult Discussed diet and exercise for person with BMI >25 Will recheck weight in 3-6 months   Labs pending Health Maintenance reviewed Diet and exercise encouraged  Follow up plan: 6 months   Mary-Margaret Hassell Done, FNP

## 2019-09-28 NOTE — Patient Instructions (Signed)
Exercising to Stay Healthy To become healthy and stay healthy, it is recommended that you do moderate-intensity and vigorous-intensity exercise. You can tell that you are exercising at a moderate intensity if your heart starts beating faster and you start breathing faster but can still hold a conversation. You can tell that you are exercising at a vigorous intensity if you are breathing much harder and faster and cannot hold a conversation while exercising. Exercising regularly is important. It has many health benefits, such as:  Improving overall fitness, flexibility, and endurance.  Increasing bone density.  Helping with weight control.  Decreasing body fat.  Increasing muscle strength.  Reducing stress and tension.  Improving overall health. How often should I exercise? Choose an activity that you enjoy, and set realistic goals. Your health care provider can help you make an activity plan that works for you. Exercise regularly as told by your health care provider. This may include:  Doing strength training two times a week, such as: ? Lifting weights. ? Using resistance bands. ? Push-ups. ? Sit-ups. ? Yoga.  Doing a certain intensity of exercise for a given amount of time. Choose from these options: ? A total of 150 minutes of moderate-intensity exercise every week. ? A total of 75 minutes of vigorous-intensity exercise every week. ? A mix of moderate-intensity and vigorous-intensity exercise every week. Children, pregnant women, people who have not exercised regularly, people who are overweight, and older adults may need to talk with a health care provider about what activities are safe to do. If you have a medical condition, be sure to talk with your health care provider before you start a new exercise program. What are some exercise ideas? Moderate-intensity exercise ideas include:  Walking 1 mile (1.6 km) in about 15  minutes.  Biking.  Hiking.  Golfing.  Dancing.  Water aerobics. Vigorous-intensity exercise ideas include:  Walking 4.5 miles (7.2 km) or more in about 1 hour.  Jogging or running 5 miles (8 km) in about 1 hour.  Biking 10 miles (16.1 km) or more in about 1 hour.  Lap swimming.  Roller-skating or in-line skating.  Cross-country skiing.  Vigorous competitive sports, such as football, basketball, and soccer.  Jumping rope.  Aerobic dancing. What are some everyday activities that can help me to get exercise?  Yard work, such as: ? Pushing a lawn mower. ? Raking and bagging leaves.  Washing your car.  Pushing a stroller.  Shoveling snow.  Gardening.  Washing windows or floors. How can I be more active in my day-to-day activities?  Use stairs instead of an elevator.  Take a walk during your lunch break.  If you drive, park your car farther away from your work or school.  If you take public transportation, get off one stop early and walk the rest of the way.  Stand up or walk around during all of your indoor phone calls.  Get up, stretch, and walk around every 30 minutes throughout the day.  Enjoy exercise with a friend. Support to continue exercising will help you keep a regular routine of activity. What guidelines can I follow while exercising?  Before you start a new exercise program, talk with your health care provider.  Do not exercise so much that you hurt yourself, feel dizzy, or get very short of breath.  Wear comfortable clothes and wear shoes with good support.  Drink plenty of water while you exercise to prevent dehydration or heat stroke.  Work out until your breathing   and your heartbeat get faster. Where to find more information  [place] Department of Health and Human Services: www.hhs.gov  Centers for Disease Control and Prevention (CDC): www.cdc.gov Summary  Exercising regularly is important. It will improve your overall fitness,  flexibility, and endurance.  Regular exercise also will improve your overall health. It can help you control your weight, reduce stress, and improve your bone density.  Do not exercise so much that you hurt yourself, feel dizzy, or get very short of breath.  Before you start a new exercise program, talk with your health care provider. This information is not intended to replace advice given to you by your health care provider. Make sure you discuss any questions you have with your health care provider. Document Revised: 03/01/2017 Document Reviewed: 02/07/2017 Elsevier Patient Education  2020 Elsevier Inc.  

## 2019-09-28 NOTE — Addendum Note (Signed)
Addended by: Cleda Daub on: 09/28/2019 10:04 AM   Modules accepted: Orders

## 2019-09-28 NOTE — Addendum Note (Signed)
Addended by: Cleda Daub on: 09/28/2019 10:10 AM   Modules accepted: Orders

## 2019-09-29 LAB — CMP14+EGFR
ALT: 24 IU/L (ref 0–32)
AST: 28 IU/L (ref 0–40)
Albumin/Globulin Ratio: 1.8 (ref 1.2–2.2)
Albumin: 4.4 g/dL (ref 3.8–4.8)
Alkaline Phosphatase: 52 IU/L (ref 48–121)
BUN/Creatinine Ratio: 14 (ref 12–28)
BUN: 12 mg/dL (ref 8–27)
Bilirubin Total: 0.8 mg/dL (ref 0.0–1.2)
CO2: 22 mmol/L (ref 20–29)
Calcium: 10.1 mg/dL (ref 8.7–10.3)
Chloride: 103 mmol/L (ref 96–106)
Creatinine, Ser: 0.83 mg/dL (ref 0.57–1.00)
GFR calc Af Amer: 86 mL/min/{1.73_m2} (ref >59)
GFR calc non Af Amer: 75 mL/min/{1.73_m2} (ref >59)
Globulin, Total: 2.5 g/dL (ref 1.5–4.5)
Glucose: 95 mg/dL (ref 65–99)
Potassium: 4.5 mmol/L (ref 3.5–5.2)
Sodium: 139 mmol/L (ref 134–144)
Total Protein: 6.9 g/dL (ref 6.0–8.5)

## 2019-09-29 LAB — CBC WITH DIFFERENTIAL/PLATELET
Basophils Absolute: 0.1 10*3/uL (ref 0.0–0.2)
Basos: 1 %
EOS (ABSOLUTE): 0.7 10*3/uL — ABNORMAL HIGH (ref 0.0–0.4)
Eos: 9 %
Hematocrit: 43.2 % (ref 34.0–46.6)
Hemoglobin: 14.3 g/dL (ref 11.1–15.9)
Immature Grans (Abs): 0 10*3/uL (ref 0.0–0.1)
Immature Granulocytes: 0 %
Lymphocytes Absolute: 2.2 10*3/uL (ref 0.7–3.1)
Lymphs: 30 %
MCH: 29.9 pg (ref 26.6–33.0)
MCHC: 33.1 g/dL (ref 31.5–35.7)
MCV: 90 fL (ref 79–97)
Monocytes Absolute: 0.7 10*3/uL (ref 0.1–0.9)
Monocytes: 9 %
Neutrophils Absolute: 3.7 10*3/uL (ref 1.4–7.0)
Neutrophils: 51 %
Platelets: 271 10*3/uL (ref 150–450)
RBC: 4.78 x10E6/uL (ref 3.77–5.28)
RDW: 12.8 % (ref 11.7–15.4)
WBC: 7.3 10*3/uL (ref 3.4–10.8)

## 2019-09-29 LAB — CYTOLOGY - PAP: Diagnosis: NEGATIVE

## 2019-09-29 LAB — THYROID PANEL WITH TSH
Free Thyroxine Index: 2 (ref 1.2–4.9)
T3 Uptake Ratio: 25 % (ref 24–39)
T4, Total: 7.8 ug/dL (ref 4.5–12.0)
TSH: 1.18 u[IU]/mL (ref 0.450–4.500)

## 2019-09-29 LAB — LIPID PANEL
Chol/HDL Ratio: 2.2 ratio (ref 0.0–4.4)
Cholesterol, Total: 132 mg/dL (ref 100–199)
HDL: 61 mg/dL (ref >39)
LDL Chol Calc (NIH): 54 mg/dL (ref 0–99)
Triglycerides: 89 mg/dL (ref 0–149)
VLDL Cholesterol Cal: 17 mg/dL (ref 5–40)

## 2019-12-04 ENCOUNTER — Other Ambulatory Visit: Payer: Self-pay

## 2019-12-04 DIAGNOSIS — Z780 Asymptomatic menopausal state: Secondary | ICD-10-CM

## 2019-12-04 DIAGNOSIS — M85851 Other specified disorders of bone density and structure, right thigh: Secondary | ICD-10-CM

## 2019-12-04 DIAGNOSIS — E559 Vitamin D deficiency, unspecified: Secondary | ICD-10-CM

## 2019-12-24 ENCOUNTER — Other Ambulatory Visit: Payer: Self-pay | Admitting: Nurse Practitioner

## 2019-12-24 DIAGNOSIS — E785 Hyperlipidemia, unspecified: Secondary | ICD-10-CM

## 2020-03-30 ENCOUNTER — Ambulatory Visit (INDEPENDENT_AMBULATORY_CARE_PROVIDER_SITE_OTHER): Payer: Medicare Other | Admitting: Nurse Practitioner

## 2020-03-30 ENCOUNTER — Encounter: Payer: Self-pay | Admitting: Nurse Practitioner

## 2020-03-30 ENCOUNTER — Other Ambulatory Visit: Payer: 59

## 2020-03-30 ENCOUNTER — Other Ambulatory Visit: Payer: Self-pay | Admitting: Nurse Practitioner

## 2020-03-30 ENCOUNTER — Other Ambulatory Visit: Payer: Self-pay

## 2020-03-30 VITALS — BP 112/71 | HR 71 | Temp 97.2°F | Resp 20 | Ht 62.0 in | Wt 140.0 lb

## 2020-03-30 DIAGNOSIS — M85851 Other specified disorders of bone density and structure, right thigh: Secondary | ICD-10-CM | POA: Diagnosis not present

## 2020-03-30 DIAGNOSIS — Z23 Encounter for immunization: Secondary | ICD-10-CM | POA: Diagnosis not present

## 2020-03-30 DIAGNOSIS — E559 Vitamin D deficiency, unspecified: Secondary | ICD-10-CM | POA: Diagnosis not present

## 2020-03-30 DIAGNOSIS — E785 Hyperlipidemia, unspecified: Secondary | ICD-10-CM

## 2020-03-30 DIAGNOSIS — N904 Leukoplakia of vulva: Secondary | ICD-10-CM

## 2020-03-30 DIAGNOSIS — M85852 Other specified disorders of bone density and structure, left thigh: Secondary | ICD-10-CM

## 2020-03-30 DIAGNOSIS — Z6826 Body mass index (BMI) 26.0-26.9, adult: Secondary | ICD-10-CM

## 2020-03-30 LAB — CMP14+EGFR
ALT: 25 IU/L (ref 0–32)
AST: 27 IU/L (ref 0–40)
Albumin/Globulin Ratio: 1.9 (ref 1.2–2.2)
Albumin: 4.5 g/dL (ref 3.8–4.8)
Alkaline Phosphatase: 57 IU/L (ref 44–121)
BUN/Creatinine Ratio: 11 — ABNORMAL LOW (ref 12–28)
BUN: 10 mg/dL (ref 8–27)
Bilirubin Total: 0.8 mg/dL (ref 0.0–1.2)
CO2: 25 mmol/L (ref 20–29)
Calcium: 11.1 mg/dL — ABNORMAL HIGH (ref 8.7–10.3)
Chloride: 102 mmol/L (ref 96–106)
Creatinine, Ser: 0.93 mg/dL (ref 0.57–1.00)
GFR calc Af Amer: 75 mL/min/{1.73_m2} (ref >59)
GFR calc non Af Amer: 65 mL/min/{1.73_m2} (ref >59)
Globulin, Total: 2.4 g/dL (ref 1.5–4.5)
Glucose: 99 mg/dL (ref 65–99)
Potassium: 4.9 mmol/L (ref 3.5–5.2)
Sodium: 139 mmol/L (ref 134–144)
Total Protein: 6.9 g/dL (ref 6.0–8.5)

## 2020-03-30 LAB — CBC WITH DIFFERENTIAL/PLATELET
Basophils Absolute: 0.1 10*3/uL (ref 0.0–0.2)
Basos: 1 %
EOS (ABSOLUTE): 1.1 10*3/uL — ABNORMAL HIGH (ref 0.0–0.4)
Eos: 12 %
Hematocrit: 44.9 % (ref 34.0–46.6)
Hemoglobin: 15 g/dL (ref 11.1–15.9)
Immature Grans (Abs): 0 10*3/uL (ref 0.0–0.1)
Immature Granulocytes: 0 %
Lymphocytes Absolute: 2.3 10*3/uL (ref 0.7–3.1)
Lymphs: 25 %
MCH: 30.9 pg (ref 26.6–33.0)
MCHC: 33.4 g/dL (ref 31.5–35.7)
MCV: 93 fL (ref 79–97)
Monocytes Absolute: 0.8 10*3/uL (ref 0.1–0.9)
Monocytes: 8 %
Neutrophils Absolute: 5.2 10*3/uL (ref 1.4–7.0)
Neutrophils: 54 %
Platelets: 293 10*3/uL (ref 150–450)
RBC: 4.85 x10E6/uL (ref 3.77–5.28)
RDW: 12.9 % (ref 11.7–15.4)
WBC: 9.5 10*3/uL (ref 3.4–10.8)

## 2020-03-30 LAB — LIPID PANEL
Chol/HDL Ratio: 2.5 ratio (ref 0.0–4.4)
Cholesterol, Total: 166 mg/dL (ref 100–199)
HDL: 66 mg/dL (ref >39)
LDL Chol Calc (NIH): 79 mg/dL (ref 0–99)
Triglycerides: 118 mg/dL (ref 0–149)
VLDL Cholesterol Cal: 21 mg/dL (ref 5–40)

## 2020-03-30 MED ORDER — ATORVASTATIN CALCIUM 20 MG PO TABS: 20.0000 mg | ORAL_TABLET | Freq: Every day | ORAL | 3 refills | Status: DC

## 2020-03-30 MED ORDER — TRIAMCINOLONE ACETONIDE 0.5 % EX CREA: 1.0000 "application " | TOPICAL_CREAM | Freq: Three times a day (TID) | CUTANEOUS | 1 refills | Status: DC

## 2020-03-30 NOTE — Progress Notes (Signed)
Subjective:    Patient ID: Janice Long, female    DOB: 12/02/1955, 64 y.o.   MRN: [id number]   Chief Complaint: Medical Management of Chronic Issues    HPI:  1. Hyperlipidemia with target LDL less than 100 Does watch diet and is very active. Lab Results  Component Value Date   CHOL 132 09/28/2019   HDL 61 09/28/2019   LDLCALC 54 09/28/2019   TRIG 89 09/28/2019   CHOLHDL 2.2 09/28/2019     2. Osteopenia of necks of both femurs Is going to have repeat dexascan today. Last t score was -1.3.   3. Lichen sclerosus of female genitalia Has occasional itching  4. Vitamin D deficiency Is on daily vitamin d supplement along with  A daily multivitamin that has calcium in it.  5. BMI 26.0-26.9,adult No recent weight changes Wt Readings from Last 3 Encounters:  03/30/20 140 lb (63.5 kg)  09/28/19 141 lb (64 kg)  08/28/18 146 lb (66.2 kg)   BMI Readings from Last 3 Encounters:  03/30/20 25.61 kg/m  09/28/19 25.79 kg/m  08/28/18 26.70 kg/m        Outpatient Encounter Medications as of 03/30/2020  Medication Sig  . albuterol (VENTOLIN HFA) 108 (90 Base) MCG/ACT inhaler Inhale 1-2 puffs into the lungs every 6 (six) hours as needed for wheezing or shortness of breath.  . Ascorbic Acid (VITAMIN C) 500 MG CAPS Take by mouth.  Marland Kitchen atorvastatin (LIPITOR) 20 MG tablet TAKE 1 TABLET BY MOUTH EVERY DAY  . Multiple Vitamin (MULTIVITAMIN WITH MINERALS) TABS tablet Take 1 tablet by mouth daily.  . Nutritional Supplements (VITAMIN D BOOSTER PO) Take by mouth.  . triamcinolone cream (KENALOG) 0.5 % Apply 1 application topically 3 (three) times daily.    Past Surgical History:  Procedure Laterality Date  . TUBAL LIGATION      Family History  Problem Relation Age of Onset  . Cancer Mother        cervical, bladder, tumor on urethra  . Diabetes Mother   . Hyperlipidemia Father   . Dementia Father     New complaints: None today  Social history: Lives with her husband.  Is retired from customer service  Controlled substance contract: n/a    Review of Systems  Constitutional: Negative for diaphoresis.  Eyes: Negative for pain.  Respiratory: Negative for shortness of breath.   Cardiovascular: Negative for chest pain, palpitations and leg swelling.  Gastrointestinal: Negative for abdominal pain.  Endocrine: Negative for polydipsia.  Skin: Negative for rash.  Neurological: Negative for dizziness, weakness and headaches.  Hematological: Does not bruise/bleed easily.  All other systems reviewed and are negative.      Objective:   Physical Exam Vitals and nursing note reviewed.  Constitutional:      General: She is not in acute distress.    Appearance: Normal appearance. She is well-developed and well-nourished.  HENT:     Head: Normocephalic.     Nose: Nose normal.     Mouth/Throat:     Mouth: Oropharynx is clear and moist.  Eyes:     Extraocular Movements: EOM normal.     Pupils: Pupils are equal, round, and reactive to light.  Neck:     Vascular: No carotid bruit or JVD.  Cardiovascular:     Rate and Rhythm: Normal rate and regular rhythm.     Pulses: Intact distal pulses.     Heart sounds: Normal heart sounds.  Pulmonary:     Effort: Pulmonary  effort is normal. No respiratory distress.     Breath sounds: Normal breath sounds. No wheezing or rales.  Chest:     Chest wall: No tenderness.  Abdominal:     General: Bowel sounds are normal. There is no distension or abdominal bruit. Aorta is normal.     Palpations: Abdomen is soft. There is no hepatomegaly, splenomegaly, mass or pulsatile mass.     Tenderness: There is no abdominal tenderness.  Musculoskeletal:        General: No edema. Normal range of motion.     Cervical back: Normal range of motion and neck supple.  Lymphadenopathy:     Cervical: No cervical adenopathy.  Skin:    General: Skin is warm and dry.  Neurological:     Mental Status: She is alert and oriented to person,  place, and time.     Deep Tendon Reflexes: Reflexes are normal and symmetric.  Psychiatric:        Mood and Affect: Mood and affect normal.        Behavior: Behavior normal.        Thought Content: Thought content normal.        Judgment: Judgment normal.    BP 112/71   Pulse 71   Temp (!) 97.2 F (36.2 C) (Temporal)   Resp 20   Ht '5\' 2"'  (1.575 m)   Wt 140 lb (63.5 kg)   SpO2 97%   BMI 25.61 kg/m         Assessment & Plan:  Janice Long comes in today with chief complaint of Medical Management of Chronic Issues   Diagnosis and orders addressed:  1. Hyperlipidemia with target LDL less than 100 Low fat diet encouraged - atorvastatin (LIPITOR) 20 MG tablet; Take 1 tablet (20 mg total) by mouth daily.  Dispense: 90 tablet; Refill: 3 - CBC with Differential/Platelet - CMP14+EGFR - Lipid panel  2. Osteopenia of necks of both femurs Weight bearing exercises  3. Lichen sclerosus of female genitalia - triamcinolone cream (KENALOG) 0.5 %; Apply 1 application topically 3 (three) times daily.  Dispense: 30 g; Refill: 1  4. Vitamin D deficiency Continue daily vitamin d supplement  5. BMI 26.0-26.9,adult Discussed diet and exercise for person with BMI >25 Will recheck weight in 3-6 months    Labs pending Health Maintenance reviewed Diet and exercise encouraged  Follow up plan: 1 year   [place], FNP

## 2020-03-30 NOTE — Patient Instructions (Signed)
Exercising to Stay Healthy To become healthy and stay healthy, it is recommended that you do moderate-intensity and vigorous-intensity exercise. You can tell that you are exercising at a moderate intensity if your heart starts beating faster and you start breathing faster but can still hold a conversation. You can tell that you are exercising at a vigorous intensity if you are breathing much harder and faster and cannot hold a conversation while exercising. Exercising regularly is important. It has many health benefits, such as:  Improving overall fitness, flexibility, and endurance.  Increasing bone density.  Helping with weight control.  Decreasing body fat.  Increasing muscle strength.  Reducing stress and tension.  Improving overall health. How often should I exercise? Choose an activity that you enjoy, and set realistic goals. Your health care provider can help you make an activity plan that works for you. Exercise regularly as told by your health care provider. This may include:  Doing strength training two times a week, such as: ? Lifting weights. ? Using resistance bands. ? Push-ups. ? Sit-ups. ? Yoga.  Doing a certain intensity of exercise for a given amount of time. Choose from these options: ? A total of 150 minutes of moderate-intensity exercise every week. ? A total of 75 minutes of vigorous-intensity exercise every week. ? A mix of moderate-intensity and vigorous-intensity exercise every week. Children, pregnant women, people who have not exercised regularly, people who are overweight, and older adults may need to talk with a health care provider about what activities are safe to do. If you have a medical condition, be sure to talk with your health care provider before you start a new exercise program. What are some exercise ideas? Moderate-intensity exercise ideas include:  Walking 1 mile (1.6 km) in about 15  minutes.  Biking.  Hiking.  Golfing.  Dancing.  Water aerobics. Vigorous-intensity exercise ideas include:  Walking 4.5 miles (7.2 km) or more in about 1 hour.  Jogging or running 5 miles (8 km) in about 1 hour.  Biking 10 miles (16.1 km) or more in about 1 hour.  Lap swimming.  Roller-skating or in-line skating.  Cross-country skiing.  Vigorous competitive sports, such as football, basketball, and soccer.  Jumping rope.  Aerobic dancing. What are some everyday activities that can help me to get exercise?  Yard work, such as: ? Pushing a lawn mower. ? Raking and bagging leaves.  Washing your car.  Pushing a stroller.  Shoveling snow.  Gardening.  Washing windows or floors. How can I be more active in my day-to-day activities?  Use stairs instead of an elevator.  Take a walk during your lunch break.  If you drive, park your car farther away from your work or school.  If you take public transportation, get off one stop early and walk the rest of the way.  Stand up or walk around during all of your indoor phone calls.  Get up, stretch, and walk around every 30 minutes throughout the day.  Enjoy exercise with a friend. Support to continue exercising will help you keep a regular routine of activity. What guidelines can I follow while exercising?  Before you start a new exercise program, talk with your health care provider.  Do not exercise so much that you hurt yourself, feel dizzy, or get very short of breath.  Wear comfortable clothes and wear shoes with good support.  Drink plenty of water while you exercise to prevent dehydration or heat stroke.  Work out until your breathing   and your heartbeat get faster. Where to find more information  [place] Department of Health and Human Services: www.hhs.gov  Centers for Disease Control and Prevention (CDC): www.cdc.gov Summary  Exercising regularly is important. It will improve your overall fitness,  flexibility, and endurance.  Regular exercise also will improve your overall health. It can help you control your weight, reduce stress, and improve your bone density.  Do not exercise so much that you hurt yourself, feel dizzy, or get very short of breath.  Before you start a new exercise program, talk with your health care provider. This information is not intended to replace advice given to you by your health care provider. Make sure you discuss any questions you have with your health care provider. Document Revised: 03/01/2017 Document Reviewed: 02/07/2017 Elsevier Patient Education  2020 Elsevier Inc.  

## 2020-06-07 ENCOUNTER — Ambulatory Visit (INDEPENDENT_AMBULATORY_CARE_PROVIDER_SITE_OTHER): Payer: Medicare Other

## 2020-06-07 ENCOUNTER — Other Ambulatory Visit: Payer: Self-pay

## 2020-06-07 DIAGNOSIS — M8588 Other specified disorders of bone density and structure, other site: Secondary | ICD-10-CM

## 2020-06-14 ENCOUNTER — Telehealth: Payer: Self-pay | Admitting: Nurse Practitioner

## 2020-06-14 NOTE — Telephone Encounter (Signed)
Called pt lmtcb

## 2020-06-14 NOTE — Telephone Encounter (Signed)
Pt wants to discuss DEXA scan

## 2020-06-15 NOTE — Telephone Encounter (Signed)
Patient aware that provider is not in office today to review - will follow up with patient after provider reviews

## 2020-08-02 ENCOUNTER — Other Ambulatory Visit: Payer: Self-pay | Admitting: Nurse Practitioner

## 2020-08-02 DIAGNOSIS — Z1231 Encounter for screening mammogram for malignant neoplasm of breast: Secondary | ICD-10-CM

## 2020-09-28 ENCOUNTER — Other Ambulatory Visit: Payer: Self-pay

## 2020-09-28 ENCOUNTER — Ambulatory Visit
Admission: RE | Admit: 2020-09-28 | Discharge: 2020-09-28 | Disposition: A | Discharge status: Home / Self Care | Payer: Medicare Other | Source: Ambulatory Visit | Attending: Nurse Practitioner | Admitting: Nurse Practitioner

## 2020-09-28 DIAGNOSIS — Z1231 Encounter for screening mammogram for malignant neoplasm of breast: Secondary | ICD-10-CM

## 2021-01-03 ENCOUNTER — Encounter: Payer: Self-pay | Admitting: Nurse Practitioner

## 2021-01-03 ENCOUNTER — Other Ambulatory Visit: Payer: Self-pay

## 2021-01-03 ENCOUNTER — Ambulatory Visit (INDEPENDENT_AMBULATORY_CARE_PROVIDER_SITE_OTHER): Payer: Medicare Other | Admitting: Nurse Practitioner

## 2021-01-03 VITALS — BP 130/68 | HR 52 | Temp 97.4°F | Resp 20 | Ht 62.0 in | Wt 137.0 lb

## 2021-01-03 DIAGNOSIS — Z0000 Encounter for general adult medical examination without abnormal findings: Secondary | ICD-10-CM

## 2021-01-03 DIAGNOSIS — Z23 Encounter for immunization: Secondary | ICD-10-CM | POA: Diagnosis not present

## 2021-01-03 NOTE — Patient Instructions (Signed)
Exercising to Stay Healthy °To become healthy and stay healthy, it is recommended that you do moderate-intensity and vigorous-intensity exercise. You can tell that you are exercising at a moderate intensity if your heart starts beating faster and you start breathing faster but can still hold a conversation. You can tell that you are exercising at a vigorous intensity if you are breathing much harder and faster and cannot hold a conversation while exercising. °How can exercise benefit me? °Exercising regularly is important. It has many health benefits, such as: °Improving overall fitness, flexibility, and endurance. °Increasing bone density. °Helping with weight control. °Decreasing body fat. °Increasing muscle strength and endurance. °Reducing stress and tension, anxiety, depression, or anger. °Improving overall health. °What guidelines should I follow while exercising? °Before you start a new exercise program, talk with your health care provider. °Do not exercise so much that you hurt yourself, feel dizzy, or get very short of breath. °Wear comfortable clothes and wear shoes with good support. °Drink plenty of water while you exercise to prevent dehydration or heat stroke. °Work out until your breathing and your heartbeat get faster (moderate intensity). °How often should I exercise? °Choose an activity that you enjoy, and set realistic goals. Your health care provider can help you make an activity plan that is individually designed and works best for you. °Exercise regularly as told by your health care provider. This may include: °Doing strength training two times a week, such as: °Lifting weights. °Using resistance bands. °Push-ups. °Sit-ups. °Yoga. °Doing a certain intensity of exercise for a given amount of time. Choose from these options: °A total of 150 minutes of moderate-intensity exercise every week. °A total of 75 minutes of vigorous-intensity exercise every week. °A mix of moderate-intensity and  vigorous-intensity exercise every week. °Children, pregnant women, people who have not exercised regularly, people who are overweight, and older adults may need to talk with a health care provider about what activities are safe to perform. If you have a medical condition, be sure to talk with your health care provider before you start a new exercise program. °What are some exercise ideas? °Moderate-intensity exercise ideas include: °Walking 1 mile (1.6 km) in about 15 minutes. °Biking. °Hiking. °Golfing. °Dancing. °Water aerobics. °Vigorous-intensity exercise ideas include: °Walking 4.5 miles (7.2 km) or more in about 1 hour. °Jogging or running 5 miles (8 km) in about 1 hour. °Biking 10 miles (16.1 km) or more in about 1 hour. °Lap swimming. °Roller-skating or in-line skating. °Cross-country skiing. °Vigorous competitive sports, such as football, basketball, and soccer. °Jumping rope. °Aerobic dancing. °What are some everyday activities that can help me get exercise? °Yard work, such as: °Pushing a lawn mower. °Raking and bagging leaves. °Washing your car. °Pushing a stroller. °Shoveling snow. °Gardening. °Washing windows or floors. °How can I be more active in my day-to-day activities? °Use stairs instead of an elevator. °Take a walk during your lunch break. °If you drive, park your car farther away from your work or school. °If you take public transportation, get off one stop early and walk the rest of the way. °Stand up or walk around during all of your indoor phone calls. °Get up, stretch, and walk around every 30 minutes throughout the day. °Enjoy exercise with a friend. Support to continue exercising will help you keep a regular routine of activity. °Where to find more information °You can find more information about exercising to stay healthy from: °[place] Department of Health and Human Services: www.hhs.gov °Centers for Disease Control and Prevention (  CDC): www.cdc.gov °Summary °Exercising regularly is  important. It will improve your overall fitness, flexibility, and endurance. °Regular exercise will also improve your overall health. It can help you control your weight, reduce stress, and improve your bone density. °Do not exercise so much that you hurt yourself, feel dizzy, or get very short of breath. °Before you start a new exercise program, talk with your health care provider. °This information is not intended to replace advice given to you by your health care provider. Make sure you discuss any questions you have with your health care provider. °Document Revised: 07/15/2020 Document Reviewed: 07/15/2020 °Elsevier Patient Education © 2022 [employer] Inc. ° °

## 2021-01-03 NOTE — Progress Notes (Signed)
Subjective:    Janice Long is a 65 y.o. female who presents for a Welcome to Medicare exam.   Review of Systems Last EKG -NSR  Review of Systems  Constitutional:  Negative for diaphoresis and weight loss.  Eyes:  Negative for blurred vision, double vision and pain.  Respiratory:  Negative for shortness of breath.   Cardiovascular:  Negative for chest pain, palpitations, orthopnea and leg swelling.  Gastrointestinal:  Negative for abdominal pain.  Genitourinary: Negative.   Musculoskeletal: Negative.   Skin:  Negative for rash.  Neurological:  Negative for dizziness, sensory change, loss of consciousness, weakness and headaches.  Endo/Heme/Allergies:  Negative for polydipsia. Does not bruise/bleed easily.  Psychiatric/Behavioral:  Negative for memory loss. The patient does not have insomnia.   All other systems reviewed and are negative.  Cardiac Risk Factors include: advanced age (>80men, >39 women);dyslipidemia      Objective:    Today's Vitals   01/03/21 0920 01/03/21 0923  BP: 130/68   Pulse: (!) 52   Resp: 20   Temp: (!) 97.4 F (36.3 C)   TempSrc: Temporal   SpO2: 99%   Weight: 137 lb (62.1 kg)   Height: 5\' 2"  (1.575 m)   PainSc:  4   Body mass index is 25.06 kg/m.  Medications Outpatient Encounter Medications as of 01/03/2021  Medication Sig   albuterol (VENTOLIN HFA) 108 (90 Base) MCG/ACT inhaler Inhale 1-2 puffs into the lungs every 6 (six) hours as needed for wheezing or shortness of breath.   Ascorbic Acid (VITAMIN C) 500 MG CAPS Take by mouth.   atorvastatin (LIPITOR) 20 MG tablet Take 1 tablet (20 mg total) by mouth daily.   Multiple Vitamin (MULTIVITAMIN WITH MINERALS) TABS tablet Take 1 tablet by mouth daily.   Nutritional Supplements (VITAMIN D BOOSTER PO) Take by mouth.   triamcinolone cream (KENALOG) 0.5 % Apply 1 application topically 3 (three) times daily.   No facility-administered encounter medications on file as of 01/03/2021.      History: Past Medical History:  Diagnosis Date   Hyperlipidemia    Past Surgical History:  Procedure Laterality Date   TUBAL LIGATION      Family History  Problem Relation Age of Onset   Cancer Mother        cervical, bladder, tumor on urethra   Hyperlipidemia Father    Dementia Father    Parkinson's disease Father    Social History   Occupational History   Not on file  Tobacco Use   Smoking status: Never   Smokeless tobacco: Never  Vaping Use   Vaping Use: Never used  Substance and Sexual Activity   Alcohol use: Yes   Drug use: No   Sexual activity: Yes    Tobacco Counseling Not a smoker  Immunizations and Health Maintenance Immunization History  Administered Date(s) Administered   Fluad Quad(high Dose 65+) 03/30/2020   Influenza, Seasonal, Injecte, Preservative Fre 03/07/2013   Influenza,inj,Quad PF,6+ Mos 01/15/2018, 03/04/2019   Moderna Sars-Covid-2 Vaccination 06/18/2019, 07/17/2019   Tdap 06/02/2015   Health Maintenance Due  Topic Date Due   HIV Screening  Never done   INFLUENZA VACCINE  10/31/2020    Activities of Daily Living In your present state of health, do you have any difficulty performing the following activities: 01/03/2021  Hearing? N  Vision? N  Difficulty concentrating or making decisions? N  Walking or climbing stairs? N  Dressing or bathing? N  Doing errands, shopping? N  Preparing Food  and eating ? N  Using the Toilet? N  In the past six months, have you accidently leaked urine? N  Do you have problems with loss of bowel control? N  Managing your Medications? N  Housekeeping or managing your Housekeeping? N  Some recent data might be hidden    Physical Exam: no physical assessment(optional), or other factors deemed appropriate based on the beneficiary's medical and social history and current clinical standards.  Advanced Directives: Does Patient Have a Medical Advance Directive?: Yes Type of Advance Directive: Healthcare  Power of Attorney Copy of Healthcare Power of Attorney in Chart?: No - copy requested Would patient like information on creating a medical advance directive?: No - Patient declined    Assessment:    This is a routine wellness examination for this patient .   Vision/Hearing screen No results found.  Dietary issues and exercise activities discussed:  Current Exercise Habits: The patient does not participate in regular exercise at present, Exercise limited by: None identified   Goals      DIET - EAT MORE FRUITS AND VEGETABLES     Exercise 150 min/wk Moderate Activity       Depression Screen PHQ 2/9 Scores 01/03/2021 03/30/2020 09/28/2019 08/28/2018  PHQ - 2 Score 0 0 0 0     Fall Risk Fall Risk  01/03/2021  Falls in the past year? 0    Cognitive Function: MMSE - Mini Mental State Exam 01/03/2021  Orientation to time 5  Orientation to Place 5  Registration 3  Attention/ Calculation 5  Recall 3  Language- name 2 objects 2  Language- repeat 1  Language- follow 3 step command 3  Language- read & follow direction 1  Write a sentence 1  Copy design 1  Total score 30        Patient Care Team: Bennie Pierini, FNP as PCP - General (Nurse Practitioner)     Plan:   Welcome to medicare  I have personally reviewed and noted the following in the patient's chart:   Medical and social history Use of alcohol, tobacco or illicit drugs  Current medications and supplements Functional ability and status Nutritional status Physical activity Advanced directives List of other physicians Hospitalizations, surgeries, and ER visits in previous 12 months Vitals Screenings to include cognitive, depression, and falls Referrals and appointments  In addition, I have reviewed and discussed with patient certain preventive protocols, quality metrics, and best practice recommendations. A written personalized care plan for preventive services as well as general preventive health  recommendations were provided to patient.     [name], [place] 01/03/2021

## 2021-03-30 ENCOUNTER — Ambulatory Visit (INDEPENDENT_AMBULATORY_CARE_PROVIDER_SITE_OTHER): Payer: Medicare Other | Admitting: Nurse Practitioner

## 2021-03-30 ENCOUNTER — Encounter: Payer: Self-pay | Admitting: Nurse Practitioner

## 2021-03-30 VITALS — BP 102/66 | HR 73 | Temp 97.8°F | Ht 62.0 in | Wt 136.2 lb

## 2021-03-30 DIAGNOSIS — E559 Vitamin D deficiency, unspecified: Secondary | ICD-10-CM

## 2021-03-30 DIAGNOSIS — M85851 Other specified disorders of bone density and structure, right thigh: Secondary | ICD-10-CM | POA: Diagnosis not present

## 2021-03-30 DIAGNOSIS — Z6826 Body mass index (BMI) 26.0-26.9, adult: Secondary | ICD-10-CM | POA: Diagnosis not present

## 2021-03-30 DIAGNOSIS — E785 Hyperlipidemia, unspecified: Secondary | ICD-10-CM

## 2021-03-30 DIAGNOSIS — K219 Gastro-esophageal reflux disease without esophagitis: Secondary | ICD-10-CM

## 2021-03-30 DIAGNOSIS — M85852 Other specified disorders of bone density and structure, left thigh: Secondary | ICD-10-CM

## 2021-03-30 LAB — CMP14+EGFR
ALT: 22 IU/L (ref 0–32)
AST: 26 IU/L (ref 0–40)
Albumin/Globulin Ratio: 1.8 (ref 1.2–2.2)
Albumin: 4.4 g/dL (ref 3.8–4.8)
Alkaline Phosphatase: 63 IU/L (ref 44–121)
BUN/Creatinine Ratio: 11 — ABNORMAL LOW (ref 12–28)
BUN: 11 mg/dL (ref 8–27)
Bilirubin Total: 0.8 mg/dL (ref 0.0–1.2)
CO2: 25 mmol/L (ref 20–29)
Calcium: 10.8 mg/dL — ABNORMAL HIGH (ref 8.7–10.3)
Chloride: 102 mmol/L (ref 96–106)
Creatinine, Ser: 0.97 mg/dL (ref 0.57–1.00)
Globulin, Total: 2.4 g/dL (ref 1.5–4.5)
Glucose: 94 mg/dL (ref 70–99)
Potassium: 4.6 mmol/L (ref 3.5–5.2)
Sodium: 140 mmol/L (ref 134–144)
Total Protein: 6.8 g/dL (ref 6.0–8.5)
eGFR: 64 mL/min/{1.73_m2} (ref >59)

## 2021-03-30 LAB — LIPID PANEL
Chol/HDL Ratio: 2.4 ratio (ref 0.0–4.4)
Cholesterol, Total: 151 mg/dL (ref 100–199)
HDL: 63 mg/dL (ref >39)
LDL Chol Calc (NIH): 69 mg/dL (ref 0–99)
Triglycerides: 103 mg/dL (ref 0–149)
VLDL Cholesterol Cal: 19 mg/dL (ref 5–40)

## 2021-03-30 LAB — CBC WITH DIFFERENTIAL/PLATELET
Basophils Absolute: 0.1 10*3/uL (ref 0.0–0.2)
Basos: 1 %
EOS (ABSOLUTE): 1 10*3/uL — ABNORMAL HIGH (ref 0.0–0.4)
Eos: 12 %
Hematocrit: 42.7 % (ref 34.0–46.6)
Hemoglobin: 15 g/dL (ref 11.1–15.9)
Immature Grans (Abs): 0 10*3/uL (ref 0.0–0.1)
Immature Granulocytes: 0 %
Lymphocytes Absolute: 2.2 10*3/uL (ref 0.7–3.1)
Lymphs: 26 %
MCH: 30.7 pg (ref 26.6–33.0)
MCHC: 35.1 g/dL (ref 31.5–35.7)
MCV: 88 fL (ref 79–97)
Monocytes Absolute: 0.8 10*3/uL (ref 0.1–0.9)
Monocytes: 10 %
Neutrophils Absolute: 4.4 10*3/uL (ref 1.4–7.0)
Neutrophils: 51 %
Platelets: 304 10*3/uL (ref 150–450)
RBC: 4.88 x10E6/uL (ref 3.77–5.28)
RDW: 12.6 % (ref 11.7–15.4)
WBC: 8.5 10*3/uL (ref 3.4–10.8)

## 2021-03-30 MED ORDER — ATORVASTATIN CALCIUM 20 MG PO TABS
20.0000 mg | ORAL_TABLET | Freq: Every day | ORAL | 3 refills | Status: DC
Start: 2021-03-30 — End: 2022-04-06

## 2021-03-30 MED ORDER — ALBUTEROL SULFATE HFA 108 (90 BASE) MCG/ACT IN AERS
1.0000 | INHALATION_SPRAY | Freq: Four times a day (QID) | RESPIRATORY_TRACT | 3 refills | Status: DC | PRN
Start: 2021-03-30 — End: 2023-02-26

## 2021-03-30 MED ORDER — PANTOPRAZOLE SODIUM 40 MG PO TBEC: 40.0000 mg | DELAYED_RELEASE_TABLET | Freq: Every day | ORAL | 3 refills | Status: DC

## 2021-03-30 NOTE — Progress Notes (Signed)
Subjective:    Patient ID: Janice Long, female    DOB: Jan 02, 1956, 65 y.o.   MRN: [id number]   Chief Complaint: Medical Management of Chronic Issues and Hip Pain (Left x 8-10 months )    HPI:  Janice Long is a 65 y.o. who identifies as a female who was assigned female at birth.   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 Hyperlipidemia with target LDL less than 100 Does try to watch diet and does very little exercise. Lab Results  Component Value Date   CHOL 166 03/30/2020   HDL 66 03/30/2020   LDLCALC 79 03/30/2020   TRIG 118 03/30/2020   CHOLHDL 2.5 03/30/2020     2. Osteopenia of necks of both femurs Last dexascan was done 06/07/20. T score -1.2  3. Vitamin D deficiency Is on daily vitamin d supplement  4. BMI 26.0-26.9,adult No recent weight changes Wt Readings from Last 3 Encounters:  03/30/21 136 lb 3.2 oz (61.8 kg)  01/03/21 137 lb (62.1 kg)  03/30/20 140 lb (63.5 kg)   BMI Readings from Last 3 Encounters:  03/30/21 24.91 kg/m  01/03/21 25.06 kg/m  03/30/20 25.61 kg/m      New complaints: Has gerd daily. Eats 3-4 tums a day.  No Known Allergies Outpatient Encounter Medications as of 03/30/2021  Medication Sig   albuterol (VENTOLIN HFA) 108 (90 Base) MCG/ACT inhaler Inhale 1-2 puffs into the lungs every 6 (six) hours as needed for wheezing or shortness of breath.   Ascorbic Acid (VITAMIN C) 500 MG CAPS Take by mouth.   atorvastatin (LIPITOR) 20 MG tablet Take 1 tablet (20 mg total) by mouth daily.   Multiple Vitamin (MULTIVITAMIN WITH MINERALS) TABS tablet Take 1 tablet by mouth daily.   Nutritional Supplements (VITAMIN D BOOSTER PO) Take by mouth.   triamcinolone cream (KENALOG) 0.5 % Apply 1 application topically 3 (three) times daily.   No facility-administered encounter medications on file as of 03/30/2021.    Past  Surgical History:  Procedure Laterality Date   TUBAL LIGATION      Family History  Problem Relation Age of Onset   Cancer Mother        cervical, bladder, tumor on urethra   Hyperlipidemia Father    Dementia Father    Parkinson's disease Father       Controlled substance contract: n/a     Review of Systems  Constitutional:  Negative for diaphoresis.  Eyes:  Negative for pain.  Respiratory:  Negative for shortness of breath.   Cardiovascular:  Negative for chest pain, palpitations and leg swelling.  Gastrointestinal:  Negative for abdominal pain.  Endocrine: Negative for polydipsia.  Skin:  Negative for rash.  Neurological:  Negative for dizziness, weakness and headaches.  Hematological:  Does not bruise/bleed easily.  All other systems reviewed and are negative.     Objective:   Physical Exam Vitals and nursing note reviewed.  Constitutional:      General: She is not in acute distress.    Appearance: Normal appearance. She is well-developed.  HENT:     Head: Normocephalic.     Right Ear: Tympanic membrane normal.     Left Ear: Tympanic membrane normal.     Nose: Nose normal.     Mouth/Throat:     Mouth: Mucous membranes are moist.  Eyes:     Pupils: Pupils  are equal, round, and reactive to light.  Neck:     Vascular: No carotid bruit or JVD.  Cardiovascular:     Rate and Rhythm: Normal rate and regular rhythm.     Heart sounds: Normal heart sounds.  Pulmonary:     Effort: Pulmonary effort is normal. No respiratory distress.     Breath sounds: Normal breath sounds. No wheezing or rales.  Chest:     Chest wall: No tenderness.  Abdominal:     General: Bowel sounds are normal. There is no distension or abdominal bruit.     Palpations: Abdomen is soft. There is no hepatomegaly, splenomegaly, mass or pulsatile mass.     Tenderness: There is no abdominal tenderness.  Musculoskeletal:        General: Normal range of motion.     Cervical back: Normal range of  motion and neck supple.  Lymphadenopathy:     Cervical: No cervical adenopathy.  Skin:    General: Skin is warm and dry.  Neurological:     Mental Status: She is alert and oriented to person, place, and time.     Deep Tendon Reflexes: Reflexes are normal and symmetric.  Psychiatric:        Behavior: Behavior normal.        Thought Content: Thought content normal.        Judgment: Judgment normal.    BP 102/66    Pulse 73    Temp 97.8 F (36.6 C) (Temporal)    Ht '5\' 2"'  (1.575 m)    Wt 136 lb 3.2 oz (61.8 kg)    SpO2 99%    BMI 24.91 kg/m        Assessment & Plan:   Janice Long comes in today with chief complaint of Medical Management of Chronic Issues and Hip Pain (Left x 8-10 months )   Diagnosis and orders addressed:  1. Hyperlipidemia with target LDL less than 100 Low fat diet - atorvastatin (LIPITOR) 20 MG tablet; Take 1 tablet (20 mg total) by mouth daily.  Dispense: 90 tablet; Refill: 3 - CBC with Differential/Platelet - CMP14+EGFR - Lipid panel  2. Osteopenia of necks of both femurs Weight bearing exercise  3. Vitamin D deficiency Continue daily vitamin d supplement  4. BMI 26.0-26.9,adult Discussed diet and exercise for person with BMI >25 Will recheck weight in 3-6 months  5. GERD Avoid spicy foods Do not eat 2 hours prior to bedtime Protonix 58m daily   Labs pending Health Maintenance reviewed Diet and exercise encouraged  Follow up plan: 1 year    [place] FNP

## 2021-03-30 NOTE — Patient Instructions (Signed)

## 2021-08-09 ENCOUNTER — Other Ambulatory Visit: Payer: Self-pay | Admitting: Nurse Practitioner

## 2021-08-09 DIAGNOSIS — K219 Gastro-esophageal reflux disease without esophagitis: Secondary | ICD-10-CM

## 2021-08-09 NOTE — Telephone Encounter (Signed)
03/30/21 RTC 1 yr ?

## 2021-11-10 ENCOUNTER — Other Ambulatory Visit: Payer: Self-pay | Admitting: Nurse Practitioner

## 2021-11-10 DIAGNOSIS — Z1231 Encounter for screening mammogram for malignant neoplasm of breast: Secondary | ICD-10-CM

## 2021-11-24 ENCOUNTER — Ambulatory Visit
Admission: RE | Admit: 2021-11-24 | Discharge: 2021-11-24 | Disposition: A | Discharge status: Home / Self Care | Payer: Medicare Other | Source: Ambulatory Visit | Attending: Nurse Practitioner | Admitting: Nurse Practitioner

## 2021-11-24 DIAGNOSIS — Z1231 Encounter for screening mammogram for malignant neoplasm of breast: Secondary | ICD-10-CM

## 2021-12-27 ENCOUNTER — Ambulatory Visit (INDEPENDENT_AMBULATORY_CARE_PROVIDER_SITE_OTHER): Payer: Medicare Other

## 2021-12-27 DIAGNOSIS — Z23 Encounter for immunization: Secondary | ICD-10-CM | POA: Diagnosis not present

## 2022-02-20 ENCOUNTER — Other Ambulatory Visit: Payer: Self-pay | Admitting: Nurse Practitioner

## 2022-02-20 DIAGNOSIS — K219 Gastro-esophageal reflux disease without esophagitis: Secondary | ICD-10-CM

## 2022-03-21 ENCOUNTER — Other Ambulatory Visit: Payer: Self-pay | Admitting: Nurse Practitioner

## 2022-03-21 DIAGNOSIS — K219 Gastro-esophageal reflux disease without esophagitis: Secondary | ICD-10-CM

## 2022-04-06 ENCOUNTER — Ambulatory Visit (INDEPENDENT_AMBULATORY_CARE_PROVIDER_SITE_OTHER): Payer: Medicare Other | Admitting: Nurse Practitioner

## 2022-04-06 ENCOUNTER — Encounter: Payer: Self-pay | Admitting: Nurse Practitioner

## 2022-04-06 ENCOUNTER — Other Ambulatory Visit (HOSPITAL_COMMUNITY)
Admission: RE | Admit: 2022-04-06 | Discharge: 2022-04-06 | Disposition: A | Discharge status: Home / Self Care | Payer: Medicare Other | Source: Ambulatory Visit | Attending: Nurse Practitioner | Admitting: Nurse Practitioner

## 2022-04-06 VITALS — BP 104/69 | HR 71 | Temp 96.9°F | Resp 20 | Ht 62.0 in | Wt 142.0 lb

## 2022-04-06 DIAGNOSIS — E785 Hyperlipidemia, unspecified: Secondary | ICD-10-CM

## 2022-04-06 DIAGNOSIS — Z6826 Body mass index (BMI) 26.0-26.9, adult: Secondary | ICD-10-CM

## 2022-04-06 DIAGNOSIS — K219 Gastro-esophageal reflux disease without esophagitis: Secondary | ICD-10-CM

## 2022-04-06 DIAGNOSIS — Z0000 Encounter for general adult medical examination without abnormal findings: Secondary | ICD-10-CM

## 2022-04-06 DIAGNOSIS — E559 Vitamin D deficiency, unspecified: Secondary | ICD-10-CM | POA: Diagnosis not present

## 2022-04-06 DIAGNOSIS — M85852 Other specified disorders of bone density and structure, left thigh: Secondary | ICD-10-CM

## 2022-04-06 DIAGNOSIS — M85851 Other specified disorders of bone density and structure, right thigh: Secondary | ICD-10-CM | POA: Diagnosis not present

## 2022-04-06 MED ORDER — ATORVASTATIN CALCIUM 20 MG PO TABS: 20.0000 mg | ORAL_TABLET | Freq: Every day | ORAL | 1 refills | Status: DC

## 2022-04-06 MED ORDER — PANTOPRAZOLE SODIUM 40 MG PO TBEC: 40.0000 mg | DELAYED_RELEASE_TABLET | Freq: Every day | ORAL | 1 refills | Status: DC

## 2022-04-06 NOTE — Progress Notes (Signed)
Subjective:    Patient ID: Janice Long, female    DOB: [birth year], 67 y.o.   MRN: [id number]   Chief Complaint: medical management of chronic issues     HPI:  Janice Long is a 67 y.o. who identifies as a female who was assigned female at birth.   Social history: Lives with: husband Work history: retired from [employer] in today for follow up of the following chronic medical issues:  1. Hyperlipidemia with target LDL less than 100 Does try to watch diet and does no dedicated exercise. Lab Results  Component Value Date   CHOL 151 03/30/2021   HDL 63 03/30/2021   LDLCALC 69 03/30/2021   TRIG 103 03/30/2021   CHOLHDL 2.4 03/30/2021     2. Osteopenia of necks of both femurs Last dexascan was done on 06/14/20. Her t score was -1.2. does not weight bearing exercise.  3. Vitamin D deficiency Takes a daily multivitamin  4. BMI 26.0-26.9, Weight is up 6lbs Wt Readings from Last 3 Encounters:  04/06/22 142 lb (64.4 kg)  03/30/21 136 lb 3.2 oz (61.8 kg)  01/03/21 137 lb (62.1 kg)   BMI Readings from Last 3 Encounters:  04/06/22 25.97 kg/m  03/30/21 24.91 kg/m  01/03/21 25.06 kg/m      New complaints: None today  No Known Allergies Outpatient Encounter Medications as of 04/06/2022  Medication Sig   albuterol (VENTOLIN HFA) 108 (90 Base) MCG/ACT inhaler Inhale 1-2 puffs into the lungs every 6 (six) hours as needed for wheezing or shortness of breath.   Ascorbic Acid (VITAMIN C) 500 MG CAPS Take by mouth.   atorvastatin (LIPITOR) 20 MG tablet Take 1 tablet (20 mg total) by mouth daily.   Multiple Vitamin (MULTIVITAMIN WITH MINERALS) TABS tablet Take 1 tablet by mouth daily.   Nutritional Supplements (VITAMIN D BOOSTER PO) Take by mouth.   pantoprazole (PROTONIX) 40 MG tablet Take 1 tablet (40 mg total) by mouth daily.   triamcinolone cream (KENALOG) 0.5 % Apply 1 application topically 3 (three) times daily.   No facility-administered  encounter medications on file as of 04/06/2022.    Past Surgical History:  Procedure Laterality Date   TUBAL LIGATION      Family History  Problem Relation Age of Onset   Cancer Mother        cervical, bladder, tumor on urethra   Hyperlipidemia Father    Dementia Father    Parkinson's disease Father       Controlled substance contract: n/a     Review of Systems  Constitutional:  Negative for diaphoresis.  Eyes:  Negative for pain.  Respiratory:  Negative for shortness of breath.   Cardiovascular:  Negative for chest pain, palpitations and leg swelling.  Gastrointestinal:  Negative for abdominal pain.  Endocrine: Negative for polydipsia.  Skin:  Negative for rash.  Neurological:  Negative for dizziness, weakness and headaches.  Hematological:  Does not bruise/bleed easily.  All other systems reviewed and are negative.      Objective:   Physical Exam Vitals and nursing note reviewed.  Constitutional:      General: She is not in acute distress.    Appearance: Normal appearance. She is well-developed.  HENT:     Head: Normocephalic.     Right Ear: Tympanic membrane normal.     Left Ear: Tympanic membrane normal.     Nose: Nose normal.     Mouth/Throat:     Mouth: Mucous  membranes are moist.  Eyes:     Pupils: Pupils are equal, round, and reactive to light.  Neck:     Vascular: No carotid bruit or JVD.  Cardiovascular:     Rate and Rhythm: Normal rate and regular rhythm.     Heart sounds: Normal heart sounds.  Pulmonary:     Effort: Pulmonary effort is normal. No respiratory distress.     Breath sounds: Normal breath sounds. No wheezing or rales.  Chest:     Chest wall: No tenderness.  Abdominal:     General: Bowel sounds are normal. There is no distension or abdominal bruit.     Palpations: Abdomen is soft. There is no hepatomegaly, splenomegaly, mass or pulsatile mass.     Tenderness: There is no abdominal tenderness.  Genitourinary:    General: Normal  vulva.     Vagina: No vaginal discharge.     Rectum: Normal.     Comments: Cervix parous and pink No adnexal masses or tenderness Musculoskeletal:        General: Normal range of motion.     Cervical back: Normal range of motion and neck supple.  Lymphadenopathy:     Cervical: No cervical adenopathy.  Skin:    General: Skin is warm and dry.  Neurological:     Mental Status: She is alert and oriented to person, place, and time.     Deep Tendon Reflexes: Reflexes are normal and symmetric.  Psychiatric:        Behavior: Behavior normal.        Thought Content: Thought content normal.        Judgment: Judgment normal.     BP 104/69   Pulse 71   Temp (!) 96.9 F (36.1 C) (Temporal)   Resp 20   Ht 5\' 2"  (1.575 m)   Wt 142 lb (64.4 kg)   SpO2 97%   BMI 25.97 kg/m        Assessment & Plan:   Janice Long comes in today with chief complaint of Annual Exam   Diagnosis and orders addressed:  1. Hyperlipidemia with target LDL less than 100 Low fat diet - CBC with Differential/Platelet - Lipid panel - atorvastatin (LIPITOR) 20 MG tablet; Take 1 tablet (20 mg total) by mouth daily.  Dispense: 90 tablet; Refill: 1  2. Osteopenia of necks of both femurs Weight bearing exercises  3. Vitamin D deficiency Continue vitamin dsupplement - VITAMIN D 25 Hydroxy (Vit-D Deficiency, Fractures)  4. BMI 26.0-26.9,adult Discussed diet and exercise for person with BMI >25 Will recheck weight in 3-6 months   5. Annual physical exam Labs pending - CMP14+EGFR - Thyroid Panel With TSH  6. Gastroesophageal reflux disease without esophagitis Avoid spicy foods Do not eat 2 hours prior to bedtime  - pantoprazole (PROTONIX) 40 MG tablet; Take 1 tablet (40 mg total) by mouth daily.  Dispense: 90 tablet; Refill: 1   Labs pending Health Maintenance reviewed Diet and exercise encouraged  Follow up plan: 6 months   Mary-Margaret Hassell Done, FNP

## 2022-04-06 NOTE — Addendum Note (Signed)
Addended by: Chevis Pretty on: 04/06/2022 10:33 AM   Modules accepted: Orders

## 2022-04-06 NOTE — Patient Instructions (Signed)
Exercising to Stay Healthy To become healthy and stay healthy, it is recommended that you do moderate-intensity and vigorous-intensity exercise. You can tell that you are exercising at a moderate intensity if your heart starts beating faster and you start breathing faster but can still hold a conversation. You can tell that you are exercising at a vigorous intensity if you are breathing much harder and faster and cannot hold a conversation while exercising. How can exercise benefit me? Exercising regularly is important. It has many health benefits, such as: Improving overall fitness, flexibility, and endurance. Increasing bone density. Helping with weight control. Decreasing body fat. Increasing muscle strength and endurance. Reducing stress and tension, anxiety, depression, or anger. Improving overall health. What guidelines should I follow while exercising? Before you start a new exercise program, talk with your health care provider. Do not exercise so much that you hurt yourself, feel dizzy, or get very short of breath. Wear comfortable clothes and wear shoes with good support. Drink plenty of water while you exercise to prevent dehydration or heat stroke. Work out until your breathing and your heartbeat get faster (moderate intensity). How often should I exercise? Choose an activity that you enjoy, and set realistic goals. Your health care provider can help you make an activity plan that is individually designed and works best for you. Exercise regularly as told by your health care provider. This may include: Doing strength training two times a week, such as: Lifting weights. Using resistance bands. Push-ups. Sit-ups. Yoga. Doing a certain intensity of exercise for a given amount of time. Choose from these options: A total of 150 minutes of moderate-intensity exercise every week. A total of 75 minutes of vigorous-intensity exercise every week. A mix of moderate-intensity and  vigorous-intensity exercise every week. Children, pregnant women, people who have not exercised regularly, people who are overweight, and older adults may need to talk with a health care provider about what activities are safe to perform. If you have a medical condition, be sure to talk with your health care provider before you start a new exercise program. What are some exercise ideas? Moderate-intensity exercise ideas include: Walking 1 mile (1.6 km) in about 15 minutes. Biking. Hiking. Golfing. Dancing. Water aerobics. Vigorous-intensity exercise ideas include: Walking 4.5 miles (7.2 km) or more in about 1 hour. Jogging or running 5 miles (8 km) in about 1 hour. Biking 10 miles (16.1 km) or more in about 1 hour. Lap swimming. Roller-skating or in-line skating. Cross-country skiing. Vigorous competitive sports, such as football, basketball, and soccer. Jumping rope. Aerobic dancing. What are some everyday activities that can help me get exercise? Yard work, such as: Pushing a lawn mower. Raking and bagging leaves. Washing your car. Pushing a stroller. Shoveling snow. Gardening. Washing windows or floors. How can I be more active in my day-to-day activities? Use stairs instead of an elevator. Take a walk during your lunch break. If you drive, park your car farther away from your work or school. If you take public transportation, get off one stop early and walk the rest of the way. Stand up or walk around during all of your indoor phone calls. Get up, stretch, and walk around every 30 minutes throughout the day. Enjoy exercise with a friend. Support to continue exercising will help you keep a regular routine of activity. Where to find more information You can find more information about exercising to stay healthy from: [place] Department of Health and Human Services: www.hhs.gov Centers for Disease Control and Prevention (  CDC): www.cdc.gov Summary Exercising regularly is  important. It will improve your overall fitness, flexibility, and endurance. Regular exercise will also improve your overall health. It can help you control your weight, reduce stress, and improve your bone density. Do not exercise so much that you hurt yourself, feel dizzy, or get very short of breath. Before you start a new exercise program, talk with your health care provider. This information is not intended to replace advice given to you by your health care provider. Make sure you discuss any questions you have with your health care provider. Document Revised: 07/15/2020 Document Reviewed: 07/15/2020 Elsevier Patient Education  2023 [employer]  

## 2022-04-07 LAB — CBC WITH DIFFERENTIAL/PLATELET
Basophils Absolute: 0.1 10*3/uL (ref 0.0–0.2)
Basos: 1 %
EOS (ABSOLUTE): 1 10*3/uL — ABNORMAL HIGH (ref 0.0–0.4)
Eos: 12 %
Hematocrit: 42 % (ref 34.0–46.6)
Hemoglobin: 14.1 g/dL (ref 11.1–15.9)
Immature Grans (Abs): 0 10*3/uL (ref 0.0–0.1)
Immature Granulocytes: 0 %
Lymphocytes Absolute: 2 10*3/uL (ref 0.7–3.1)
Lymphs: 23 %
MCH: 29.4 pg (ref 26.6–33.0)
MCHC: 33.6 g/dL (ref 31.5–35.7)
MCV: 88 fL (ref 79–97)
Monocytes Absolute: 0.8 10*3/uL (ref 0.1–0.9)
Monocytes: 9 %
Neutrophils Absolute: 5 10*3/uL (ref 1.4–7.0)
Neutrophils: 55 %
Platelets: 289 10*3/uL (ref 150–450)
RBC: 4.8 x10E6/uL (ref 3.77–5.28)
RDW: 12.2 % (ref 11.7–15.4)
WBC: 8.9 10*3/uL (ref 3.4–10.8)

## 2022-04-07 LAB — CMP14+EGFR
ALT: 24 IU/L (ref 0–32)
AST: 26 IU/L (ref 0–40)
Albumin/Globulin Ratio: 1.8 (ref 1.2–2.2)
Albumin: 4.5 g/dL (ref 3.9–4.9)
Alkaline Phosphatase: 81 IU/L (ref 44–121)
BUN/Creatinine Ratio: 13 (ref 12–28)
BUN: 13 mg/dL (ref 8–27)
Bilirubin Total: 0.8 mg/dL (ref 0.0–1.2)
CO2: 23 mmol/L (ref 20–29)
Calcium: 9.8 mg/dL (ref 8.7–10.3)
Chloride: 103 mmol/L (ref 96–106)
Creatinine, Ser: 0.98 mg/dL (ref 0.57–1.00)
Globulin, Total: 2.5 g/dL (ref 1.5–4.5)
Glucose: 96 mg/dL (ref 70–99)
Potassium: 4.3 mmol/L (ref 3.5–5.2)
Sodium: 141 mmol/L (ref 134–144)
Total Protein: 7 g/dL (ref 6.0–8.5)
eGFR: 63 mL/min/{1.73_m2} (ref >59)

## 2022-04-07 LAB — THYROID PANEL WITH TSH
Free Thyroxine Index: 2.2 (ref 1.2–4.9)
T3 Uptake Ratio: 25 % (ref 24–39)
T4, Total: 8.6 ug/dL (ref 4.5–12.0)
TSH: 1.17 u[IU]/mL (ref 0.450–4.500)

## 2022-04-07 LAB — LIPID PANEL
Chol/HDL Ratio: 2.2 ratio (ref 0.0–4.4)
Cholesterol, Total: 155 mg/dL (ref 100–199)
HDL: 69 mg/dL (ref >39)
LDL Chol Calc (NIH): 75 mg/dL (ref 0–99)
Triglycerides: 55 mg/dL (ref 0–149)
VLDL Cholesterol Cal: 11 mg/dL (ref 5–40)

## 2022-04-07 LAB — VITAMIN D 25 HYDROXY (VIT D DEFICIENCY, FRACTURES): Vit D, 25-Hydroxy: 45.4 ng/mL (ref 30.0–100.0)

## 2022-04-10 ENCOUNTER — Ambulatory Visit (INDEPENDENT_AMBULATORY_CARE_PROVIDER_SITE_OTHER): Payer: Medicare Other

## 2022-04-10 VITALS — Ht 62.0 in | Wt 142.0 lb

## 2022-04-10 DIAGNOSIS — Z780 Asymptomatic menopausal state: Secondary | ICD-10-CM | POA: Diagnosis not present

## 2022-04-10 DIAGNOSIS — Z1211 Encounter for screening for malignant neoplasm of colon: Secondary | ICD-10-CM

## 2022-04-10 DIAGNOSIS — Z0000 Encounter for general adult medical examination without abnormal findings: Secondary | ICD-10-CM | POA: Diagnosis not present

## 2022-04-10 LAB — CYTOLOGY - PAP: Diagnosis: NEGATIVE

## 2022-04-10 NOTE — Patient Instructions (Signed)
Janice Long , Thank you for taking time to come for your Medicare Wellness Visit. I appreciate your ongoing commitment to your health goals. Please review the following plan we discussed and let me know if I can assist you in the future.   These are the goals we discussed:  Goals      DIET - EAT MORE FRUITS AND VEGETABLES     Exercise 150 min/wk Moderate Activity        This is a list of the screening recommended for you and due dates:  Health Maintenance  Topic Date Due   COVID-19 Vaccine (3 - 2023-24 season) 04/22/2022*   Zoster (Shingles) Vaccine (1 of 2) 07/06/2022*   Pneumonia Vaccine (1 - PCV) 04/07/2023*   Colon Cancer Screening  05/20/2022   DEXA scan (bone density measurement)  06/11/2022   Mammogram  11/25/2022   Medicare Annual Wellness Visit  04/11/2023   DTaP/Tdap/Td vaccine (2 - Td or Tdap) 06/01/2025   Flu Shot  Completed   Hepatitis C Screening: USPSTF Recommendation to screen - Ages 18-79 yo.  Completed   HPV Vaccine  Aged Out  *Topic was postponed. The date shown is not the original due date.    Advanced directives: Please bring a copy of your health care power of attorney and living will to the office to be added to your chart at your convenience.   Conditions/risks identified: Aim for 30 minutes of exercise or brisk walking, 6-8 glasses of water, and 5 servings of fruits and vegetables each day.   Next appointment: Follow up in one year for your annual wellness visit    Preventive Care 65 Years and Older, Female Preventive care refers to lifestyle choices and visits with your health care provider that can promote health and wellness. What does preventive care include? A yearly physical exam. This is also called an annual well check. Dental exams once or twice a year. Routine eye exams. Ask your health care provider how often you should have your eyes checked. Personal lifestyle choices, including: Daily care of your teeth and gums. Regular physical  activity. Eating a healthy diet. Avoiding tobacco and drug use. Limiting alcohol use. Practicing safe sex. Taking low-dose aspirin every day. Taking vitamin and mineral supplements as recommended by your health care provider. What happens during an annual well check? The services and screenings done by your health care provider during your annual well check will depend on your age, overall health, lifestyle risk factors, and family history of disease. Counseling  Your health care provider may ask you questions about your: Alcohol use. Tobacco use. Drug use. Emotional well-being. Home and relationship well-being. Sexual activity. Eating habits. History of falls. Memory and ability to understand (cognition). Work and work Statistician. Reproductive health. Screening  You may have the following tests or measurements: Height, weight, and BMI. Blood pressure. Lipid and cholesterol levels. These may be checked every 5 years, or more frequently if you are over 65 years old. Skin check. Lung cancer screening. You may have this screening every year starting at age 35 if you have a 30-pack-year history of smoking and currently smoke or have quit within the past 15 years. Fecal occult blood test (FOBT) of the stool. You may have this test every year starting at age 21. Flexible sigmoidoscopy or colonoscopy. You may have a sigmoidoscopy every 5 years or a colonoscopy every 10 years starting at age 59. Hepatitis C blood test. Hepatitis B blood test. Sexually transmitted disease (STD) testing.  Diabetes screening. This is done by checking your blood sugar (glucose) after you have not eaten for a while (fasting). You may have this done every 1-3 years. Bone density scan. This is done to screen for osteoporosis. You may have this done starting at age 28. Mammogram. This may be done every 1-2 years. Talk to your health care provider about how often you should have regular mammograms. Talk with your  health care provider about your test results, treatment options, and if necessary, the need for more tests. Vaccines  Your health care provider may recommend certain vaccines, such as: Influenza vaccine. This is recommended every year. Tetanus, diphtheria, and acellular pertussis (Tdap, Td) vaccine. You may need a Td booster every 10 years. Zoster vaccine. You may need this after age 46. Pneumococcal 13-valent conjugate (PCV13) vaccine. One dose is recommended after age 18. Pneumococcal polysaccharide (PPSV23) vaccine. One dose is recommended after age 60. Talk to your health care provider about which screenings and vaccines you need and how often you need them. This information is not intended to replace advice given to you by your health care provider. Make sure you discuss any questions you have with your health care provider. Document Released: 04/15/2015 Document Revised: 12/07/2015 Document Reviewed: 01/18/2015 Elsevier Interactive Patient Education  2017 [employer] Prevention in the Home Falls can cause injuries. They can happen to people of all ages. There are many things you can do to make your home safe and to help prevent falls. What can I do on the outside of my home? Regularly fix the edges of walkways and driveways and fix any cracks. Remove anything that might make you trip as you walk through a door, such as a raised step or threshold. Trim any bushes or trees on the path to your home. Use bright outdoor lighting. Clear any walking paths of anything that might make someone trip, such as rocks or tools. Regularly check to see if handrails are loose or broken. Make sure that both sides of any steps have handrails. Any raised decks and porches should have guardrails on the edges. Have any leaves, snow, or ice cleared regularly. Use sand or salt on walking paths during winter. Clean up any spills in your garage right away. This includes oil or grease spills. What can I  do in the bathroom? Use night lights. Install grab bars by the toilet and in the tub and shower. Do not use towel bars as grab bars. Use non-skid mats or decals in the tub or shower. If you need to sit down in the shower, use a plastic, non-slip stool. Keep the floor dry. Clean up any water that spills on the floor as soon as it happens. Remove soap buildup in the tub or shower regularly. Attach bath mats securely with double-sided non-slip rug tape. Do not have throw rugs and other things on the floor that can make you trip. What can I do in the bedroom? Use night lights. Make sure that you have a light by your bed that is easy to reach. Do not use any sheets or blankets that are too big for your bed. They should not hang down onto the floor. Have a firm chair that has side arms. You can use this for support while you get dressed. Do not have throw rugs and other things on the floor that can make you trip. What can I do in the kitchen? Clean up any spills right away. Avoid walking on wet floors.  Keep items that you use a lot in easy-to-reach places. If you need to reach something above you, use a strong step stool that has a grab bar. Keep electrical cords out of the way. Do not use floor polish or wax that makes floors slippery. If you must use wax, use non-skid floor wax. Do not have throw rugs and other things on the floor that can make you trip. What can I do with my stairs? Do not leave any items on the stairs. Make sure that there are handrails on both sides of the stairs and use them. Fix handrails that are broken or loose. Make sure that handrails are as long as the stairways. Check any carpeting to make sure that it is firmly attached to the stairs. Fix any carpet that is loose or worn. Avoid having throw rugs at the top or bottom of the stairs. If you do have throw rugs, attach them to the floor with carpet tape. Make sure that you have a light switch at the top of the stairs  and the bottom of the stairs. If you do not have them, ask someone to add them for you. What else can I do to help prevent falls? Wear shoes that: Do not have high heels. Have rubber bottoms. Are comfortable and fit you well. Are closed at the toe. Do not wear sandals. If you use a stepladder: Make sure that it is fully opened. Do not climb a closed stepladder. Make sure that both sides of the stepladder are locked into place. Ask someone to hold it for you, if possible. Clearly mark and make sure that you can see: Any grab bars or handrails. First and last steps. Where the edge of each step is. Use tools that help you move around (mobility aids) if they are needed. These include: Canes. Walkers. Scooters. Crutches. Turn on the lights when you go into a dark area. Replace any light bulbs as soon as they burn out. Set up your furniture so you have a clear path. Avoid moving your furniture around. If any of your floors are uneven, fix them. If there are any pets around you, be aware of where they are. Review your medicines with your doctor. Some medicines can make you feel dizzy. This can increase your chance of falling. Ask your doctor what other things that you can do to help prevent falls. This information is not intended to replace advice given to you by your health care provider. Make sure you discuss any questions you have with your health care provider. Document Released: 01/13/2009 Document Revised: 08/25/2015 Document Reviewed: 04/23/2014 Elsevier Interactive Patient Education  2017 [employer].

## 2022-04-10 NOTE — Progress Notes (Signed)
Subjective:   Janice Long is a 67 y.o. female who presents for Medicare Annual (Subsequent) preventive examination. I connected with  Consepcion Hearing on 04/10/22 by a audio enabled telemedicine application and verified that I am speaking with the correct person using two identifiers.  Patient Location: Home  Provider Location: Home Office  I discussed the limitations of evaluation and management by telemedicine. The patient expressed understanding and agreed to proceed.  Review of Systems     Cardiac Risk Factors include: advanced age (>63men, >32 women)     Objective:    Today's Vitals   04/10/22 1033  Weight: 142 lb (64.4 kg)  Height: 5\' 2"  (1.575 m)   Body mass index is 25.97 kg/m.     04/10/2022   10:35 AM 01/03/2021    9:28 AM 06/01/2014   10:58 AM  Advanced Directives  Does Patient Have a Medical Advance Directive? Yes Yes No  Type of 08/01/2014 of [place];Living will Healthcare Power of Attorney   Copy of Healthcare Power of Attorney in Chart? No - copy requested No - copy requested   Would patient like information on creating a medical advance directive?  No - Patient declined No - patient declined information    Current Medications (verified) Outpatient Encounter Medications as of 04/10/2022  Medication Sig   albuterol (VENTOLIN HFA) 108 (90 Base) MCG/ACT inhaler Inhale 1-2 puffs into the lungs every 6 (six) hours as needed for wheezing or shortness of breath.   Ascorbic Acid (VITAMIN C) 500 MG CAPS Take by mouth.   atorvastatin (LIPITOR) 20 MG tablet Take 1 tablet (20 mg total) by mouth daily.   CALCIUM PO Take by mouth.   Multiple Vitamin (MULTIVITAMIN WITH MINERALS) TABS tablet Take 1 tablet by mouth daily.   Nutritional Supplements (VITAMIN D BOOSTER PO) Take by mouth.   pantoprazole (PROTONIX) 40 MG tablet Take 1 tablet (40 mg total) by mouth daily.   triamcinolone cream (KENALOG) 0.5 % Apply 1 application topically 3 (three) times  daily.   No facility-administered encounter medications on file as of 04/10/2022.    Allergies (verified) Patient has no known allergies.   History: Past Medical History:  Diagnosis Date   Hyperlipidemia    Past Surgical History:  Procedure Laterality Date   TUBAL LIGATION     Family History  Problem Relation Age of Onset   Cancer Mother        cervical, bladder, tumor on urethra   Hyperlipidemia Father    Dementia Father    Parkinson's disease Father    Social History   Socioeconomic History   Marital status: Married    Spouse name: Not on file   Number of children: 3   Years of education: Not on file   Highest education level: Bachelor's degree (e.g., BA, AB, BS)  Occupational History   Not on file  Tobacco Use   Smoking status: Never   Smokeless tobacco: Never  Vaping Use   Vaping Use: Never used  Substance and Sexual Activity   Alcohol use: Yes   Drug use: No   Sexual activity: Yes  Other Topics Concern   Not on file  Social History Narrative   Not on file   Social Determinants of Health   Financial Resource Strain: Low Risk  (04/10/2022)   Overall Financial Resource Strain (CARDIA)    Difficulty of Paying Living Expenses: Not hard at all  Food Insecurity: No Food Insecurity (04/10/2022)   Hunger  Vital Sign    Worried About Programme researcher, broadcasting/film/video in the Last Year: Never true    Ran Out of Food in the Last Year: Never true  Transportation Needs: No Transportation Needs (04/10/2022)   PRAPARE - Administrator, Civil Service (Medical): No    Lack of Transportation (Non-Medical): No  Physical Activity: Insufficiently Active (04/10/2022)   Exercise Vital Sign    Days of Exercise per Week: 3 days    Minutes of Exercise per Session: 30 min  Stress: No Stress Concern Present (04/10/2022)   [employer] of Occupational Health - Occupational Stress Questionnaire    Feeling of Stress : Not at all  Social Connections: Moderately Integrated (04/10/2022)    Social Connection and Isolation Panel [NHANES]    Frequency of Communication with Friends and Family: More than three times a week    Frequency of Social Gatherings with Friends and Family: More than three times a week    Attends Religious Services: More than 4 times per year    Active Member of [employer] or Organizations: No    Attends Engineer, structural: Never    Marital Status: Married    Tobacco Counseling Counseling given: Not Answered   Clinical Intake:  Pre-visit preparation completed: Yes  Pain : No/denies pain     Nutritional Risks: None Diabetes: No  How often do you need to have someone help you when you read instructions, pamphlets, or other written materials from your doctor or pharmacy?: 1 - Never  Diabetic?no  Interpreter Needed?: No  Information entered by :: Renie Ora, LPN   Activities of Daily Living    04/10/2022   10:35 AM  In your present state of health, do you have any difficulty performing the following activities:  Hearing? 0  Vision? 0  Difficulty concentrating or making decisions? 0  Walking or climbing stairs? 0  Dressing or bathing? 0  Doing errands, shopping? 0  Preparing Food and eating ? N  Using the Toilet? N  In the past six months, have you accidently leaked urine? N  Do you have problems with loss of bowel control? N  Managing your Medications? N  Managing your Finances? N  Housekeeping or managing your Housekeeping? N    Patient Care Team: Bennie Pierini, FNP as PCP - General (Nurse Practitioner)  Indicate any recent Medical Services you may have received from other than [facility] providers in the past year (date may be approximate).     Assessment:   This is a routine wellness examination for [place].  Hearing/Vision screen Vision Screening - Comments:: Wears rx glasses - up to date with routine eye exams with  Dr.Davis  Dietary issues and exercise activities discussed: Current Exercise Habits: Home  exercise routine, Type of exercise: walking, Time (Minutes): 30, Frequency (Times/Week): 3, Weekly Exercise (Minutes/Week): 90, Intensity: Mild, Exercise limited by: None identified   Goals Addressed             This Visit's Progress    DIET - EAT MORE FRUITS AND VEGETABLES   On track      Depression Screen    04/10/2022   10:34 AM 04/06/2022   10:08 AM 03/30/2021    9:04 AM 01/03/2021    9:29 AM 03/30/2020   10:13 AM 09/28/2019    9:18 AM 08/28/2018    3:55 PM  PHQ 2/9 Scores  PHQ - 2 Score 0 0 0 0 0 0 0  PHQ- 9 Score 0 0  0        Fall Risk    04/10/2022   10:33 AM 04/06/2022   10:08 AM 03/30/2021    9:04 AM 01/03/2021    9:29 AM 03/30/2020   10:13 AM  Fall Risk   Falls in the past year? 0 0 0 0 0  Number falls in past yr: 0      Injury with Fall? 0      Risk for fall due to : No Fall Risks      Follow up Falls prevention discussed        FALL RISK PREVENTION PERTAINING TO THE HOME:  Any stairs in or around the home? Yes  If so, are there any without handrails? No  Home free of loose throw rugs in walkways, pet beds, electrical cords, etc? Yes  Adequate lighting in your home to reduce risk of falls? Yes   ASSISTIVE DEVICES UTILIZED TO PREVENT FALLS:  Life alert? No  Use of a cane, walker or w/c? No  Grab bars in the bathroom? No  Shower chair or bench in shower? No  Elevated toilet seat or a handicapped toilet? No       01/03/2021    9:30 AM  MMSE - Mini Mental State Exam  Orientation to time 5  Orientation to Place 5  Registration 3  Attention/ Calculation 5  Recall 3  Language- name 2 objects 2  Language- repeat 1  Language- follow 3 step command 3  Language- read & follow direction 1  Write a sentence 1  Copy design 1  Total score 30        04/10/2022   10:35 AM  6CIT Screen  What Year? 0 points  What month? 0 points  What time? 0 points  Count back from 20 0 points  Months in reverse 0 points  Repeat phrase 0 points  Total Score 0 points     Immunizations Immunization History  Administered Date(s) Administered   Fluad Quad(high Dose 65+) 03/30/2020, 01/03/2021, 12/27/2021   Influenza, Seasonal, Injecte, Preservative Fre 03/07/2013   Influenza,inj,Quad PF,6+ Mos 01/15/2018, 03/04/2019   Moderna Sars-Covid-2 Vaccination 06/18/2019, 07/17/2019   Tdap 06/02/2015    TDAP status: Up to date  Flu Vaccine status: Up to date  Pneumococcal vaccine status: Due, Education has been provided regarding the importance of this vaccine. Advised may receive this vaccine at local pharmacy or Health Dept. Aware to provide a copy of the vaccination record if obtained from local pharmacy or Health Dept. Verbalized acceptance and understanding.  Covid-19 vaccine status: Completed vaccines  Qualifies for Shingles Vaccine? Yes   Zostavax completed No   Shingrix Completed?: No.    Education has been provided regarding the importance of this vaccine. Patient has been advised to call insurance company to determine out of pocket expense if they have not yet received this vaccine. Advised may also receive vaccine at local pharmacy or Health Dept. Verbalized acceptance and understanding.  Screening Tests Health Maintenance  Topic Date Due   COVID-19 Vaccine (3 - 2023-24 season) 04/22/2022 (Originally 12/01/2021)   Zoster Vaccines- Shingrix (1 of 2) 07/06/2022 (Originally 03/11/2005)   Pneumonia Vaccine 33+ Years old (1 - PCV) 04/07/2023 (Originally 03/11/2020)   COLONOSCOPY (Pts 45-67yrs Insurance coverage will need to be confirmed)  05/20/2022   DEXA SCAN  06/11/2022   MAMMOGRAM  11/25/2022   Medicare Annual Wellness (AWV)  04/11/2023   DTaP/Tdap/Td (2 - Td or Tdap) 06/01/2025   INFLUENZA VACCINE  Completed  Hepatitis C Screening  Completed   HPV VACCINES  Aged Out    Health Maintenance  There are no preventive care reminders to display for this patient.   Colorectal cancer screening: Type of screening: Colonoscopy. Completed  05/20/2012. Repeat every 10 years  Mammogram status: Completed 11/24/2021. Repeat every year  Bone Density status: Completed 06/10/2020. Results reflect: Bone density results: OSTEOPOROSIS. Repeat every 2 years.  Lung Cancer Screening: (Low Dose CT Chest recommended if Age 63-80 years, 30 pack-year currently smoking OR have quit w/in 15years.) does not qualify.   Lung Cancer Screening Referral: n/a  Additional Screening:  Hepatitis C Screening: does not qualify; Completed 06/02/2015  Vision Screening: Recommended annual ophthalmology exams for early detection of glaucoma and other disorders of the eye. Is the patient up to date with their annual eye exam?  Yes  Who is the provider or what is the name of the office in which the patient attends annual eye exams? Dr.Davis  If pt is not established with a provider, would they like to be referred to a provider to establish care? No .   Dental Screening: Recommended annual dental exams for proper oral hygiene  Community Resource Referral / Chronic Care Management: CRR required this visit?  No   CCM required this visit?  No      Plan:     I have personally reviewed and noted the following in the patient's chart:   Medical and social history Use of alcohol, tobacco or illicit drugs  Current medications and supplements including opioid prescriptions. Patient is not currently taking opioid prescriptions. Functional ability and status Nutritional status Physical activity Advanced directives List of other physicians Hospitalizations, surgeries, and ER visits in previous 12 months Vitals Screenings to include cognitive, depression, and falls Referrals and appointments  In addition, I have reviewed and discussed with patient certain preventive protocols, quality metrics, and best practice recommendations. A written personalized care plan for preventive services as well as general preventive health recommendations were provided to  patient.     Daphane Shepherd, LPN   10/03/4191   Nurse Notes: Due pneumonia vaccine

## 2022-04-16 NOTE — Addendum Note (Signed)
Addended by: Chevis Pretty on: 04/16/2022 04:13 PM   Modules accepted: Level of Service

## 2022-04-20 ENCOUNTER — Telehealth: Payer: Self-pay | Admitting: Gastroenterology

## 2022-04-20 NOTE — Telephone Encounter (Signed)
Good afternoon Dr. Lyndel Safe,   Supervising MD 04/19/22  We received a referral for this patient for a screening colonoscopy. Patient had colonoscopy in 06/2011. Patient had since then moved to [facility] and would like to stay in the [facility] system with her care. Records are being sent up for you review. Please advise on scheduling.   Thank you.

## 2022-04-27 NOTE — Telephone Encounter (Signed)
I cannot find the last colonoscopy report. I have looked at care everywhere/media/procedure tab  Can you please look at last colonoscopy report and print it out for me please Thx RG

## 2022-04-27 NOTE — Telephone Encounter (Signed)
Records were printed and sent up for your review.   Thank you.

## 2022-05-07 ENCOUNTER — Encounter: Payer: Self-pay | Admitting: Gastroenterology

## 2022-05-07 NOTE — Telephone Encounter (Signed)
Spoke with patient's husband, will have patient call back to schedule when she gets home.

## 2022-06-06 ENCOUNTER — Ambulatory Visit (AMBULATORY_SURGERY_CENTER): Payer: Medicare Other | Admitting: *Deleted

## 2022-06-06 VITALS — Ht 62.0 in | Wt 142.0 lb

## 2022-06-06 DIAGNOSIS — Z1211 Encounter for screening for malignant neoplasm of colon: Secondary | ICD-10-CM

## 2022-06-06 MED ORDER — NA SULFATE-K SULFATE-MG SULF 17.5-3.13-1.6 GM/177ML PO SOLN: 1.0000 | Freq: Once | ORAL | 0 refills | Status: AC

## 2022-06-06 NOTE — Progress Notes (Signed)
No egg or soy allergy known to patient  No issues known to pt with past sedation with any surgeries or procedures Patient denies ever being told they had issues or difficulty with intubation  No FH of Malignant Hyperthermia Pt is not on diet pills Pt is not on  home 02  Pt is not on blood thinners  Pt denies issues with constipation  No A fib or A flutter Have any cardiac testing pending--NO Pt instructed to use Singlecare.com or GoodRx for a price reduction on prep    Patient's chart reviewed by Osvaldo Angst CNRA prior to previsit and patient appropriate for the [place].  Previsit completed and red dot placed by patient's name on their procedure day (on provider's schedule).

## 2022-06-11 ENCOUNTER — Ambulatory Visit (INDEPENDENT_AMBULATORY_CARE_PROVIDER_SITE_OTHER): Payer: Medicare Other

## 2022-06-11 DIAGNOSIS — Z780 Asymptomatic menopausal state: Secondary | ICD-10-CM | POA: Diagnosis not present

## 2022-06-14 ENCOUNTER — Telehealth: Payer: Self-pay | Admitting: Nurse Practitioner

## 2022-06-15 NOTE — Telephone Encounter (Signed)
Patient aware of results.

## 2022-07-03 ENCOUNTER — Encounter: Payer: Self-pay | Admitting: Gastroenterology

## 2022-07-03 ENCOUNTER — Ambulatory Visit (AMBULATORY_SURGERY_CENTER): Payer: Medicare Other | Admitting: Gastroenterology

## 2022-07-03 VITALS — BP 113/65 | HR 62 | Temp 96.2°F | Resp 15 | Ht 62.0 in | Wt 146.0 lb

## 2022-07-03 DIAGNOSIS — Z1211 Encounter for screening for malignant neoplasm of colon: Secondary | ICD-10-CM

## 2022-07-03 MED ORDER — SODIUM CHLORIDE 0.9 % IV SOLN: 500.0000 mL | Freq: Once | INTRAVENOUS | Status: DC

## 2022-07-03 NOTE — Progress Notes (Signed)
[facility] History and Physical   Primary Care Physician:  Chevis Pretty, FNP   Reason for Procedure:    CRC screening  Plan:     colonoscopy     HPI: Janice Long is a 67 y.o. female  No nausea, vomiting, heartburn, regurgitation, odynophagia or dysphagia.  No significant diarrhea or constipation.  No melena or hematochezia. No unintentional weight loss. No abdominal pain.   previous colonoscopy 2013- diverticulosis, hyperplastic polyps  Past Medical History:  Diagnosis Date   Allergy    SEASONAL   Asthma    GERD (gastroesophageal reflux disease)    Hyperlipidemia    Osteopenia     Past Surgical History:  Procedure Laterality Date   COLONOSCOPY     TUBAL LIGATION      Prior to Admission medications   Medication Sig Start Date End Date Taking? Authorizing Provider  Ascorbic Acid (VITAMIN C) 500 MG CAPS Take by mouth daily.   Yes [provider]  atorvastatin (LIPITOR) 20 MG tablet Take 1 tablet (20 mg total) by mouth daily. 04/06/22  Yes Martin, Mary-Margaret, FNP  Multiple Vitamin (MULTIVITAMIN WITH MINERALS) TABS tablet Take 1 tablet by mouth daily. 06/13/15  Yes Eckard, Tammy, RPH-CPP  Nutritional Supplements (VITAMIN D BOOSTER PO) Take by mouth daily.   Yes [provider]  pantoprazole (PROTONIX) 40 MG tablet Take 1 tablet (40 mg total) by mouth daily. 04/06/22  Yes Hassell Done, Mary-Margaret, FNP  albuterol (VENTOLIN HFA) 108 (90 Base) MCG/ACT inhaler Inhale 1-2 puffs into the lungs every 6 (six) hours as needed for wheezing or shortness of breath. 03/30/21   Hassell Done, Mary-Margaret, FNP  CALCIUM PO Take by mouth daily. TAKE ONE    [provider]  triamcinolone cream (KENALOG) 0.5 % Apply 1 application topically 3 (three) times daily. Patient taking differently: Apply 1 application  topically as needed. 03/30/20   Chevis Pretty, FNP    Current Outpatient Medications  Medication Sig Dispense Refill   Ascorbic Acid  (VITAMIN C) 500 MG CAPS Take by mouth daily.     atorvastatin (LIPITOR) 20 MG tablet Take 1 tablet (20 mg total) by mouth daily. 90 tablet 1   Multiple Vitamin (MULTIVITAMIN WITH MINERALS) TABS tablet Take 1 tablet by mouth daily.     Nutritional Supplements (VITAMIN D BOOSTER PO) Take by mouth daily.     pantoprazole (PROTONIX) 40 MG tablet Take 1 tablet (40 mg total) by mouth daily. 90 tablet 1   albuterol (VENTOLIN HFA) 108 (90 Base) MCG/ACT inhaler Inhale 1-2 puffs into the lungs every 6 (six) hours as needed for wheezing or shortness of breath. 1 each 3   CALCIUM PO Take by mouth daily. TAKE ONE     triamcinolone cream (KENALOG) 0.5 % Apply 1 application topically 3 (three) times daily. (Patient taking differently: Apply 1 application  topically as needed.) 30 g 1   Current Facility-Administered Medications  Medication Dose Route Frequency Provider Last Rate Last Admin   0.9 %  sodium chloride infusion  500 mL Intravenous Once Armbruster, Carlota Raspberry, MD        Allergies as of 07/03/2022   (No Known Allergies)    Family History  Problem Relation Age of Onset   Cancer Mother        cervical, bladder, tumor on urethra   Colon polyps Father    Hyperlipidemia Father    Dementia Father    Parkinson's disease Father    Colon cancer Neg Hx    Crohn's  disease Neg Hx    Esophageal cancer Neg Hx    Rectal cancer Neg Hx    Stomach cancer Neg Hx    Ulcerative colitis Neg Hx     Social History   Socioeconomic History   Marital status: Married    Spouse name: Not on file   Number of children: 3   Years of education: Not on file   Highest education level: Bachelor's degree (e.g., BA, AB, BS)  Occupational History   Not on file  Tobacco Use   Smoking status: Never   Smokeless tobacco: Never  Vaping Use   Vaping Use: Never used  Substance and Sexual Activity   Alcohol use: Yes    Comment: OCCASSIONALLY   Drug use: No   Sexual activity: Yes    Birth control/protection:  Post-menopausal  Other Topics Concern   Not on file  Social History Narrative   Not on file   Social Determinants of Health   Financial Resource Strain: Low Risk  (04/10/2022)   Overall Financial Resource Strain (CARDIA)    Difficulty of Paying Living Expenses: Not hard at all  Food Insecurity: No Food Insecurity (04/10/2022)   Hunger Vital Sign    Worried About Running Out of Food in the Last Year: Never true    Ran Out of Food in the Last Year: Never true  Transportation Needs: No Transportation Needs (04/10/2022)   PRAPARE - Hydrologist (Medical): No    Lack of Transportation (Non-Medical): No  Physical Activity: Insufficiently Active (04/10/2022)   Exercise Vital Sign    Days of Exercise per Week: 3 days    Minutes of Exercise per Session: 30 min  Stress: No Stress Concern Present (04/10/2022)   [place]    Feeling of Stress : Not at all  Social Connections: Moderately Integrated (04/10/2022)   Social Connection and Isolation Panel [NHANES]    Frequency of Communication with Friends and Family: More than three times a week    Frequency of Social Gatherings with Friends and Family: More than three times a week    Attends Religious Services: More than 4 times per year    Active Member of [employer] or Organizations: No    Attends Archivist Meetings: Never    Marital Status: Married  Human resources officer Violence: Not At Risk (04/10/2022)   Humiliation, Afraid, Rape, and Kick questionnaire    Fear of Current or Ex-Partner: No    Emotionally Abused: No    Physically Abused: No    Sexually Abused: No    Review of Systems: Positive for none All other review of systems negative except as mentioned in the HPI.  Physical Exam: Vital signs in last 24 hours: @VSRANGES @   General:   Alert,  Well-developed, well-nourished, pleasant and cooperative in NAD Lungs:  Clear throughout to  auscultation.   Heart:  Regular rate and rhythm; no murmurs, clicks, rubs,  or gallops. Abdomen:  Soft, nontender and nondistended. Normal bowel sounds.   Neuro/Psych:  Alert and cooperative. Normal mood and affect. A and O x 3    No significant changes were identified.  The patient continues to be an appropriate candidate for the planned procedure and anesthesia.   Carmell Austria, MD. [facility] 07/03/2022 8:13 AM@

## 2022-07-03 NOTE — Op Note (Signed)
[facility] Patient Name: Janice Long Procedure Date: 07/03/2022 8:15 AM MRN: [id number] Endoscopist: Jackquline Denmark , MD, [id number] Age: 67 Referring MD:  Date of Birth: 01/03/1956 Gender: Female Account #: [id number] Procedure:                Colonoscopy Indications:              Screening for colorectal malignant neoplasm Medicines:                Monitored Anesthesia Care Procedure:                Pre-Anesthesia Assessment:                           - Prior to the procedure, a History and Physical                            was performed, and patient medications and                            allergies were reviewed. The patient's tolerance of                            previous anesthesia was also reviewed. The risks                            and benefits of the procedure and the sedation                            options and risks were discussed with the patient.                            All questions were answered, and informed consent                            was obtained. Prior Anticoagulants: The patient has                            taken no anticoagulant or antiplatelet agents. ASA                            Grade Assessment: II - A patient with mild systemic                            disease. After reviewing the risks and benefits,                            the patient was deemed in satisfactory condition to                            undergo the procedure.                           After obtaining informed consent, the colonoscope  was passed under direct vision. Throughout the                            procedure, the patient's blood pressure, pulse, and                            oxygen saturations were monitored continuously. The                            [id number]-HQ190L Colonoscope [id number] was introduced                            through the anus and advanced to the the cecum,                            identified by  appendiceal orifice and ileocecal                            valve. The colonoscopy was performed without                            difficulty. The patient tolerated the procedure                            well. The quality of the bowel preparation was                            good. The ileocecal valve, appendiceal orifice, and                            rectum were photographed. Scope In: 8:18:26 AM Scope Out: 8:32:54 AM Scope Withdrawal Time: 0 hours 10 minutes 23 seconds  Total Procedure Duration: 0 hours 14 minutes 28 seconds  Findings:                 Multiple medium-mouthed diverticula were found in                            the sigmoid colon, few in descending, transverse                            colon and ascending colon.                           Non-bleeding internal hemorrhoids were found during                            retroflexion. The hemorrhoids were small and Grade                            I (internal hemorrhoids that do not prolapse).                           The exam was otherwise without abnormality on  direct and retroflexion views. Complications:            No immediate complications. Estimated Blood Loss:     Estimated blood loss: none. Impression:               - Moderate predominantly sigmoid diverticulosis.                           - Non-bleeding internal hemorrhoids.                           - The examination was otherwise normal on direct                            and retroflexion views.                           - No specimens collected. Recommendation:           - Patient has a contact number available for                            emergencies. The signs and symptoms of potential                            delayed complications were discussed with the                            patient. Return to normal activities tomorrow.                            Written discharge instructions were provided to the                             patient.                           - High fiber diet.                           - Continue present medications.                           - Repeat colonoscopy is not recommended due to                            current age (52 years or older) for screening                            purposes. Hence, repeat colonoscopy only if with                            any new problems or change in family history.                           - Return to GI office PRN.                           -  The findings and recommendations were discussed                            with the patient's family. Jackquline Denmark, MD 07/03/2022 8:38:53 AM This report has been signed electronically.

## 2022-07-03 NOTE — Patient Instructions (Signed)
Resume all of your previous medications.  Read all of the handouts given to you by your recovery room nurse.  YOU HAD AN ENDOSCOPIC PROCEDURE TODAY AT [facility]:   Refer to the procedure report that was given to you for any specific questions about what was found during the examination.  If the procedure report does not answer your questions, please call your gastroenterologist to clarify.  If you requested that your care partner not be given the details of your procedure findings, then the procedure report has been included in a sealed envelope for you to review at your convenience later.  YOU SHOULD EXPECT: Some feelings of bloating in the abdomen. Passage of more gas than usual.  Walking can help get rid of the air that was put into your GI tract during the procedure and reduce the bloating. If you had a lower endoscopy (such as a colonoscopy or flexible sigmoidoscopy) you may notice spotting of blood in your stool or on the toilet paper. If you underwent a bowel prep for your procedure, you may not have a normal bowel movement for a few days.  Please Note:  You might notice some irritation and congestion in your nose or some drainage.  This is from the oxygen used during your procedure.  There is no need for concern and it should clear up in a day or so.  SYMPTOMS TO REPORT IMMEDIATELY:  Following lower endoscopy (colonoscopy or flexible sigmoidoscopy):  Excessive amounts of blood in the stool  Significant tenderness or worsening of abdominal pains  Swelling of the abdomen that is new, acute  Fever of 100F or higher   For urgent or emergent issues, a gastroenterologist can be reached at any hour by calling [phone]. Do not use MyChart messaging for urgent concerns.    DIET:  We do recommend a small meal at first, but then you may proceed to your regular diet.  Drink plenty of fluids but you should avoid alcoholic beverages for 24 hours. Try to eat a high fiber diet,  and drink plenty of water.  ACTIVITY:  You should plan to take it easy for the rest of today and you should NOT DRIVE or use heavy machinery until tomorrow (because of the sedation medicines used during the test).    FOLLOW UP: Our staff will call the number listed on your records the next business day following your procedure.  We will call around 7:15- 8:00 am to check on you and address any questions or concerns that you may have regarding the information given to you following your procedure. If we do not reach you, we will leave a message.      SIGNATURES/CONFIDENTIALITY: You and/or your care partner have signed paperwork which will be entered into your electronic medical record.  These signatures attest to the fact that that the information above on your After Visit Summary has been reviewed and is understood.  Full responsibility of the confidentiality of this discharge information lies with you and/or your care-partner.

## 2022-07-03 NOTE — Progress Notes (Signed)
Pt's states no medical or surgical changes since previsit or office visit. 

## 2022-07-03 NOTE — Progress Notes (Signed)
Report to PACU, RN, vss, BBS= Clear.  

## 2022-07-04 ENCOUNTER — Telehealth: Payer: Self-pay | Admitting: *Deleted

## 2022-07-04 NOTE — Telephone Encounter (Signed)
  Follow up Call-     07/03/2022    7:13 AM  Call back number  Post procedure Call Back phone  # [phone]  Permission to leave phone message Yes     Patient questions:  Do you have a fever, pain , or abdominal swelling? No. Pain Score  0 *  Have you tolerated food without any problems? Yes.    Have you been able to return to your normal activities? Yes.    Do you have any questions about your discharge instructions: Diet   No. Medications  No. Follow up visit  No.  Do you have questions or concerns about your Care? No.  Actions: * If pain score is 4 or above: No action needed, pain <4.

## 2022-11-29 IMAGING — MG MM DIGITAL SCREENING BILAT W/ TOMO AND CAD
8 series · 9 of 24 positions shown · non-contrast
Comparison: Previous exam(s).

CLINICAL DATA: Screening.

EXAM:
DIGITAL SCREENING BILATERAL MAMMOGRAM WITH TOMOSYNTHESIS AND CAD
TECHNIQUE: Bilateral screening digital craniocaudal and mediolateral oblique
mammograms were obtained. Bilateral screening digital breast
tomosynthesis was performed. The images were evaluated with
computer-aided detection.

[R MLO synth-2D]
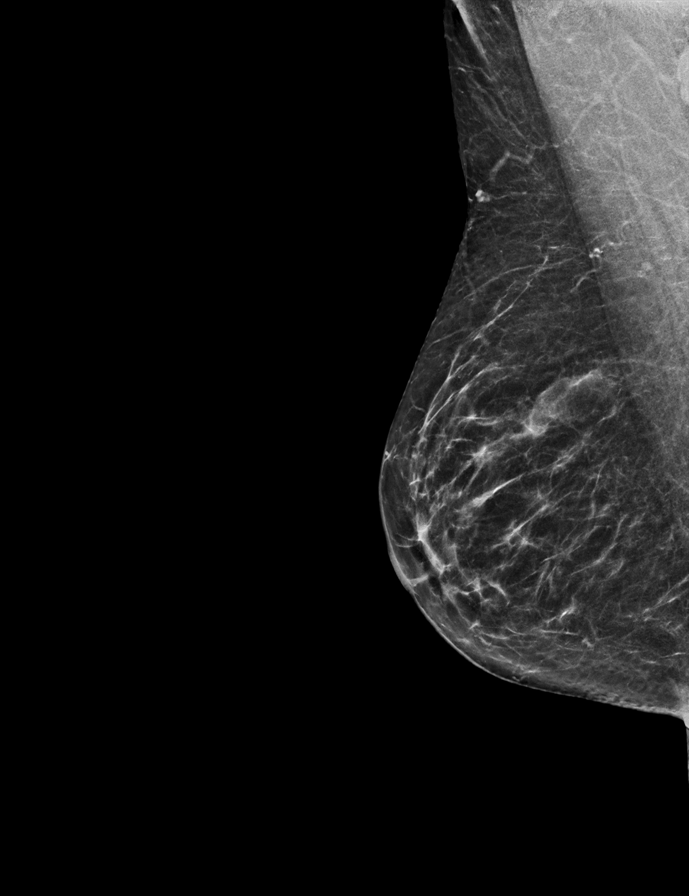

[L MLO synth-2D]
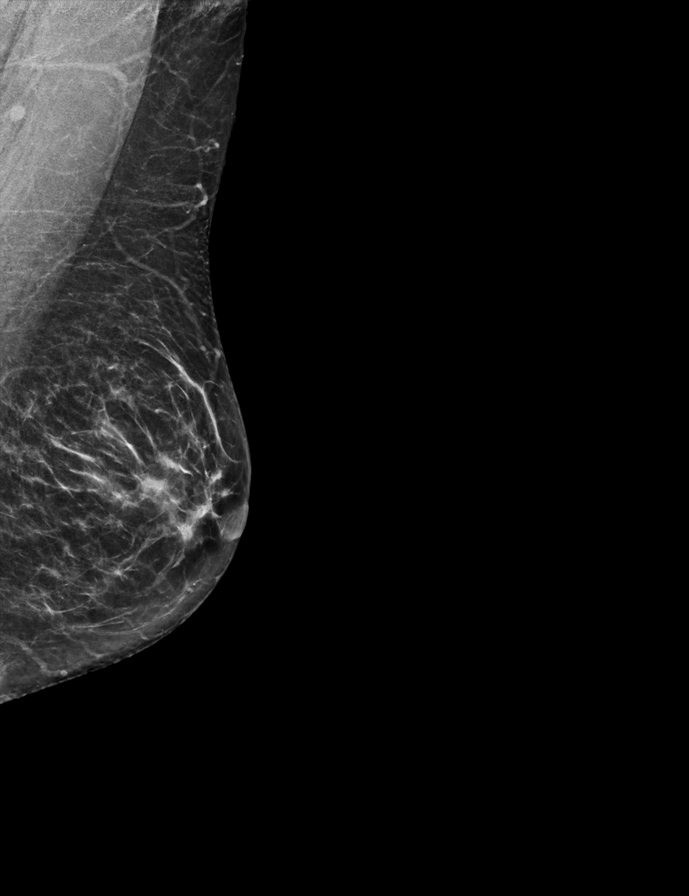

[L CC synth-2D]
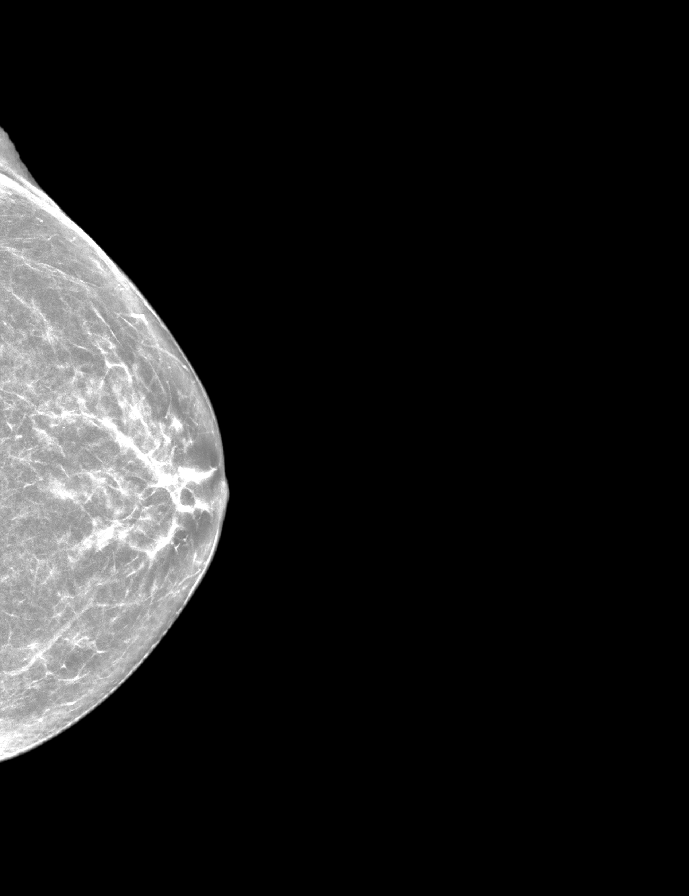

[R CC synth-2D]
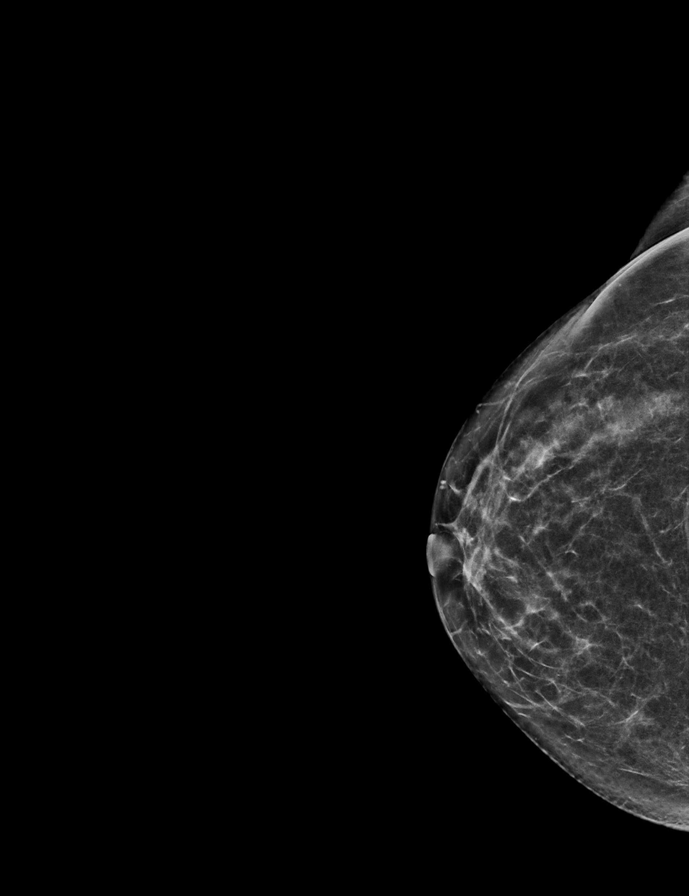

[L CC tomo · 2 of 55 frames shown]
[frame 18/55]
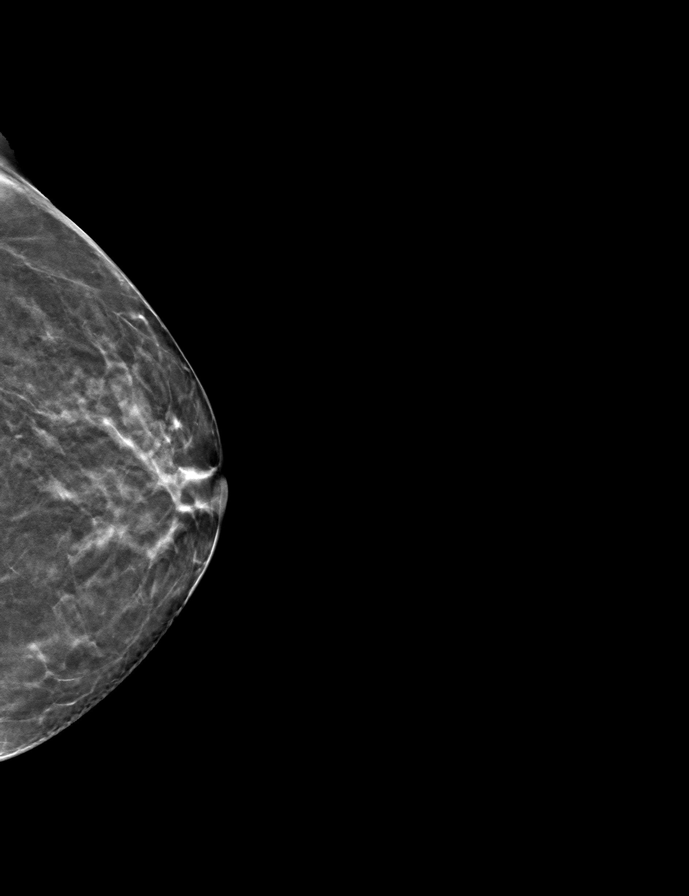
[frame 28/55]
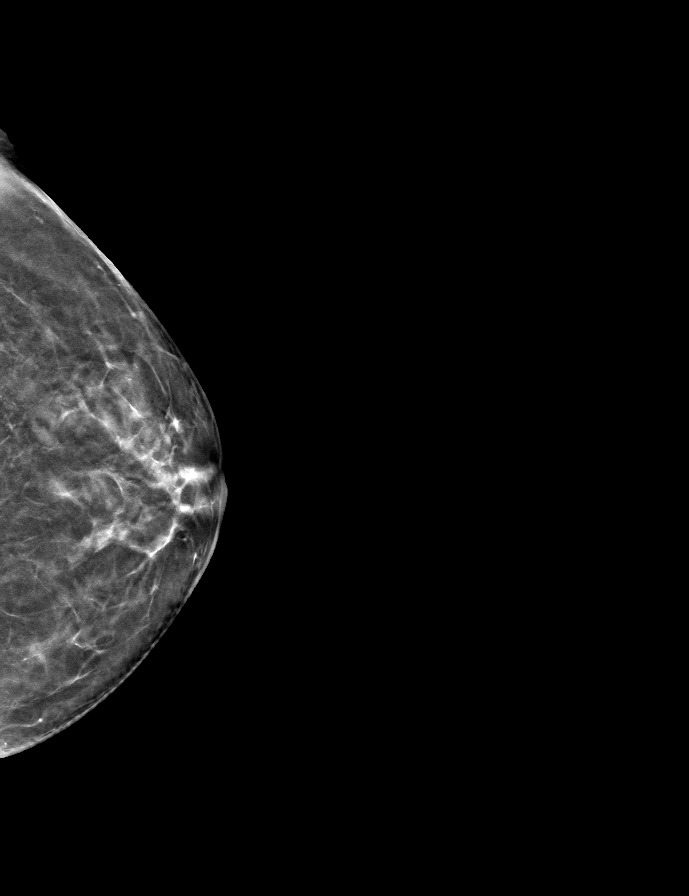

[R MLO tomo · tomo slice 26/51.0]
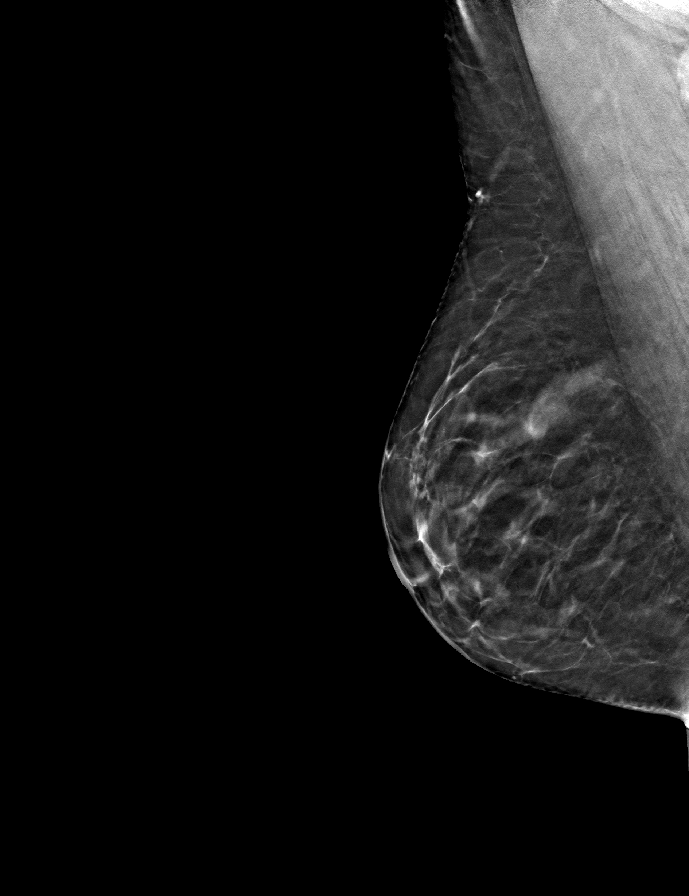

[L MLO tomo · tomo slice 29/57.0]
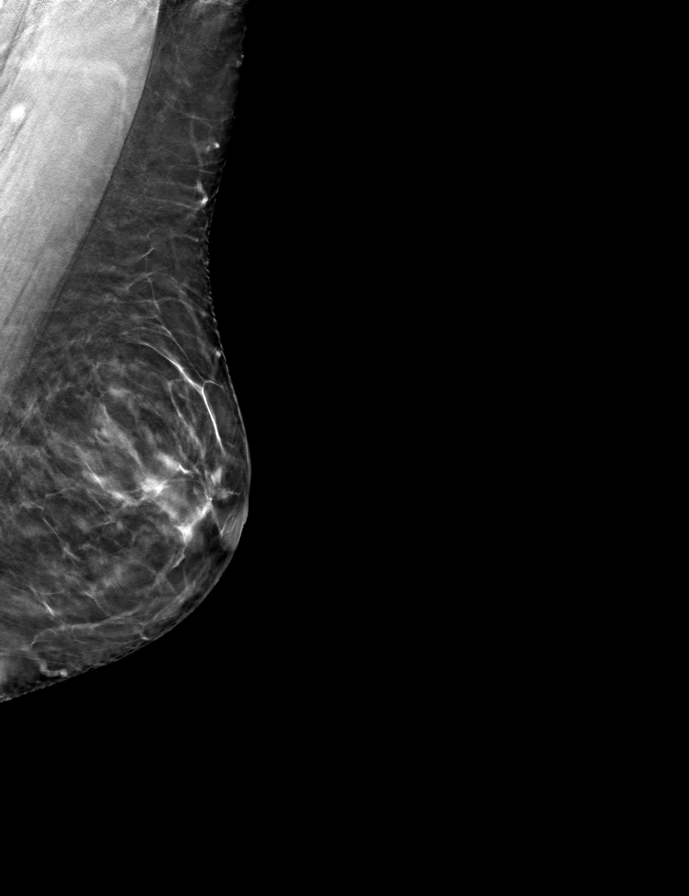

[R CC tomo · tomo slice 29/56.0]
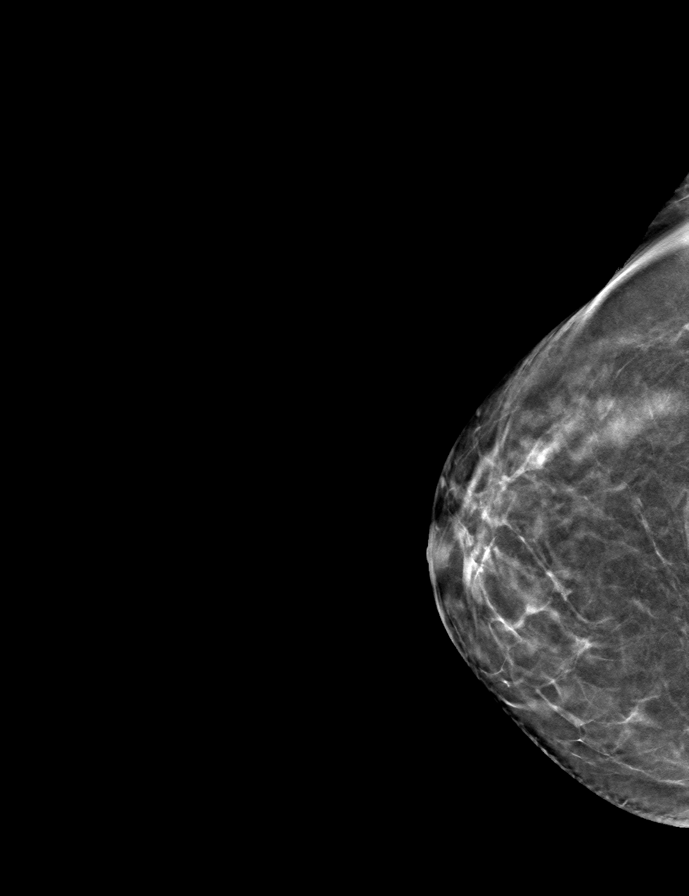

[9 of 24 positions shown; findings below may reference images not displayed]

ACR Breast Density Category b: There are scattered areas of
fibroglandular density.
FINDINGS: There are no findings suspicious for malignancy.
IMPRESSION: No mammographic evidence of malignancy. A result letter of this
screening mammogram will be mailed directly to the patient.

RECOMMENDATION:
Screening mammogram in one year. (Code:51-O-LD2)

BI-RADS CATEGORY  1: Negative.

## 2022-12-01 ENCOUNTER — Other Ambulatory Visit: Payer: Self-pay | Admitting: Nurse Practitioner

## 2022-12-01 DIAGNOSIS — E785 Hyperlipidemia, unspecified: Secondary | ICD-10-CM

## 2023-01-16 ENCOUNTER — Other Ambulatory Visit: Payer: Self-pay | Admitting: Nurse Practitioner

## 2023-01-16 DIAGNOSIS — K219 Gastro-esophageal reflux disease without esophagitis: Secondary | ICD-10-CM

## 2023-02-26 ENCOUNTER — Other Ambulatory Visit: Payer: Self-pay | Admitting: Nurse Practitioner

## 2023-04-12 ENCOUNTER — Encounter: Payer: Medicare Other | Admitting: Nurse Practitioner

## 2023-04-12 ENCOUNTER — Ambulatory Visit (INDEPENDENT_AMBULATORY_CARE_PROVIDER_SITE_OTHER): Payer: Medicare Other

## 2023-04-12 VITALS — Ht 62.0 in | Wt 146.0 lb

## 2023-04-12 DIAGNOSIS — Z0000 Encounter for general adult medical examination without abnormal findings: Secondary | ICD-10-CM | POA: Diagnosis not present

## 2023-04-12 DIAGNOSIS — Z1231 Encounter for screening mammogram for malignant neoplasm of breast: Secondary | ICD-10-CM

## 2023-04-12 NOTE — Progress Notes (Signed)
 Subjective:   Janice Long is a 68 y.o. female who presents for Medicare Annual (Subsequent) preventive examination.  Visit Complete: Virtual I connected with  Janice Long on 04/12/23 by a audio enabled telemedicine application and verified that I am speaking with the correct person using two identifiers.  Patient Location: Home  Provider Location: Home Office  I discussed the limitations of evaluation and management by telemedicine. The patient expressed understanding and agreed to proceed.  This patient declined Interactive audio and acupuncturist. Therefore the visit was completed with audio only.  Vital Signs: Because this visit was a virtual/telehealth visit, some criteria may be missing or patient reported. Any vitals not documented were not able to be obtained and vitals that have been documented are patient reported.  Cardiac Risk Factors include: advanced age (>30men, >68 women)     Objective:    Today's Vitals   04/12/23 1005  Weight: 146 lb (66.2 kg)  Height: 5' 2 (1.575 m)   Body mass index is 26.7 kg/m.     04/12/2023   12:15 PM 04/10/2022   10:35 AM 01/03/2021    9:28 AM 06/01/2014   10:58 AM  Advanced Directives  Does Patient Have a Medical Advance Directive? No Yes Yes No  Type of Special Educational Needs Teacher of [place];Living will Healthcare Power of Attorney   Copy of Healthcare Power of Attorney in Chart?  No - copy requested No - copy requested   Would patient like information on creating a medical advance directive? Yes (MAU/Ambulatory/Procedural Areas - Information given)  No - Patient declined No - patient declined information    Current Medications (verified) Outpatient Encounter Medications as of 04/12/2023  Medication Sig   albuterol  (VENTOLIN  HFA) 108 (90 Base) MCG/ACT inhaler INHALE 1-2 PUFFS BY MOUTH EVERY 6 HOURS AS NEEDED FOR WHEEZE OR SHORTNESS OF BREATH   Ascorbic Acid (VITAMIN C) 500 MG CAPS Take by mouth daily.    atorvastatin  (LIPITOR) 20 MG tablet TAKE 1 TABLET BY MOUTH EVERY DAY   CALCIUM  PO Take by mouth daily. TAKE ONE   Multiple Vitamin (MULTIVITAMIN WITH MINERALS) TABS tablet Take 1 tablet by mouth daily.   Nutritional Supplements (VITAMIN D  BOOSTER PO) Take by mouth daily.   pantoprazole  (PROTONIX ) 40 MG tablet TAKE 1 TABLET BY MOUTH EVERY DAY   triamcinolone  cream (KENALOG ) 0.5 % Apply 1 application topically 3 (three) times daily. (Patient taking differently: Apply 1 application  topically as needed.)   No facility-administered encounter medications on file as of 04/12/2023.    Allergies (verified) Patient has no known allergies.   History: Past Medical History:  Diagnosis Date   Allergy    SEASONAL   Asthma    GERD (gastroesophageal reflux disease)    Hyperlipidemia    Osteopenia    Past Surgical History:  Procedure Laterality Date   COLONOSCOPY     TUBAL LIGATION     Family History  Problem Relation Age of Onset   Cancer Mother        cervical, bladder, tumor on urethra   Colon polyps Father    Hyperlipidemia Father    Dementia Father    Parkinson's disease Father    Colon cancer Neg Hx    Crohn's disease Neg Hx    Esophageal cancer Neg Hx    Rectal cancer Neg Hx    Stomach cancer Neg Hx    Ulcerative colitis Neg Hx    Social History   Socioeconomic History  Marital status: Married    Spouse name: Not on file   Number of children: 3   Years of education: Not on file   Highest education level: Bachelor's degree (e.g., BA, AB, BS)  Occupational History   Not on file  Tobacco Use   Smoking status: Never   Smokeless tobacco: Never  Vaping Use   Vaping status: Never Used  Substance and Sexual Activity   Alcohol use: Yes    Comment: OCCASSIONALLY   Drug use: No   Sexual activity: Yes    Birth control/protection: Post-menopausal  Other Topics Concern   Not on file  Social History Narrative   Not on file   Social Drivers of Health   Financial Resource  Strain: Low Risk  (04/10/2022)   Overall Financial Resource Strain (CARDIA)    Difficulty of Paying Living Expenses: Not hard at all  Food Insecurity: No Food Insecurity (04/10/2022)   Hunger Vital Sign    Worried About Running Out of Food in the Last Year: Never true    Ran Out of Food in the Last Year: Never true  Transportation Needs: No Transportation Needs (04/10/2022)   PRAPARE - Administrator, Civil Service (Medical): No    Lack of Transportation (Non-Medical): No  Physical Activity: Insufficiently Active (04/10/2022)   Exercise Vital Sign    Days of Exercise per Week: 3 days    Minutes of Exercise per Session: 30 min  Stress: No Stress Concern Present (04/10/2022)   [employer] of Occupational Health - Occupational Stress Questionnaire    Feeling of Stress : Not at all  Social Connections: Moderately Integrated (04/10/2022)   Social Connection and Isolation Panel [NHANES]    Frequency of Communication with Friends and Family: More than three times a week    Frequency of Social Gatherings with Friends and Family: More than three times a week    Attends Religious Services: More than 4 times per year    Active Member of [employer] or Organizations: No    Attends Engineer, Structural: Never    Marital Status: Married    Tobacco Counseling Counseling given: Not Answered   Clinical Intake:  Pre-visit preparation completed: Yes  Pain : No/denies pain     Diabetes: No  How often do you need to have someone help you when you read instructions, pamphlets, or other written materials from your doctor or pharmacy?: 1 - Never  Interpreter Needed?: No  Information entered by :: Janice Long   Activities of Daily Living    04/12/2023   11:44 AM  In your present state of health, do you have any difficulty performing the following activities:  Hearing? 0  Vision? 0  Difficulty concentrating or making decisions? 0  Walking or climbing stairs? 0  Dressing  or bathing? 0  Doing errands, shopping? 0  Preparing Food and eating ? N  Using the Toilet? N  In the past six months, have you accidently leaked urine? N  Do you have problems with loss of bowel control? N  Managing your Medications? N  Managing your Finances? N  Housekeeping or managing your Housekeeping? N    Patient Care Team: Gladis Mustard, FNP as PCP - General (Nurse Practitioner) Dr Will[facility], OD  Indicate any recent Medical Services you may have received from other than [facility] providers in the past year (date may be approximate).     Assessment:   This is a routine wellness examination for  Janice Long.  Hearing/Vision screen Hearing Screening - Comments:: Denies hearing difficulties   Vision Screening - Comments:: Wears rx glasses - up to date with routine eye exams with Dr. Willma Moats     Goals Addressed             This Visit's Progress    Remain active and independent        Depression Screen    04/12/2023   12:15 PM 04/10/2022   10:34 AM 04/06/2022   10:08 AM 03/30/2021    9:04 AM 01/03/2021    9:29 AM 03/30/2020   10:13 AM 09/28/2019    9:18 AM  PHQ 2/9 Scores  PHQ - 2 Score 0 0 0 0 0 0 0  PHQ- 9 Score  0 0 0       Fall Risk    04/12/2023   11:44 AM 04/10/2022   10:33 AM 04/06/2022   10:08 AM 03/30/2021    9:04 AM 01/03/2021    9:29 AM  Fall Risk   Falls in the past year? 0 0 0 0 0  Number falls in past yr: 0 0     Injury with Fall? 0 0     Risk for fall due to : No Fall Risks No Fall Risks     Follow up Falls prevention discussed;Education provided;Falls evaluation completed Falls prevention discussed       MEDICARE RISK AT HOME: Medicare Risk at Home Any stairs in or around the home?: No If so, are there any without handrails?: No Home free of loose throw rugs in walkways, pet beds, electrical cords, etc?: Yes Adequate lighting in your home to reduce risk of falls?: Yes Life alert?: No Use of a cane, walker or  w/c?: No Grab bars in the bathroom?: Yes Shower chair or bench in shower?: No Elevated toilet seat or a handicapped toilet?: Yes  TIMED UP AND GO:  Was the test performed?  No    Cognitive Function:    01/03/2021    9:30 AM  MMSE - Mini Mental State Exam  Orientation to time 5  Orientation to Place 5  Registration 3  Attention/ Calculation 5  Recall 3  Language- name 2 objects 2  Language- repeat 1  Language- follow 3 step command 3  Language- read & follow direction 1  Write a sentence 1  Copy design 1  Total score 30        04/12/2023   12:15 PM 04/10/2022   10:35 AM  6CIT Screen  What Year? 0 points 0 points  What month? 0 points 0 points  What time? 0 points 0 points  Count back from 20 0 points 0 points  Months in reverse 0 points 0 points  Repeat phrase 0 points 0 points  Total Score 0 points 0 points    Immunizations Immunization History  Administered Date(s) Administered   Fluad Quad(high Dose 65+) 03/30/2020, 01/03/2021, 12/27/2021   Influenza, Seasonal, Injecte, Preservative Fre 03/07/2013   Influenza,inj,Quad PF,6+ Mos 01/15/2018, 03/04/2019   Moderna Sars-Covid-2 Vaccination 06/18/2019, 07/17/2019   Tdap 06/02/2015    TDAP status: Up to date  Flu Vaccine status: Declined, Education has been provided regarding the importance of this vaccine but patient still declined. Advised may receive this vaccine at local pharmacy or Health Dept. Aware to provide a copy of the vaccination record if obtained from local pharmacy or Health Dept. Verbalized acceptance and understanding.  Pneumococcal vaccine status: Declined,  Education has been provided regarding  the importance of this vaccine but patient still declined. Advised may receive this vaccine at local pharmacy or Health Dept. Aware to provide a copy of the vaccination record if obtained from local pharmacy or Health Dept. Verbalized acceptance and understanding.   Covid-19 vaccine status: Information  provided on how to obtain vaccines.   Qualifies for Shingles Vaccine? Yes   Zostavax completed No   Shingrix Completed?: No.    Education has been provided regarding the importance of this vaccine. Patient has been advised to call insurance company to determine out of pocket expense if they have not yet received this vaccine. Advised may also receive vaccine at local pharmacy or Health Dept. Verbalized acceptance and understanding.  Screening Tests Health Maintenance  Topic Date Due   Zoster Vaccines- Shingrix (1 of 2) Never done   Pneumonia Vaccine 34+ Years old (1 of 1 - PCV) Never done   MAMMOGRAM  11/25/2022   COVID-19 Vaccine (3 - 2024-25 season) 12/02/2022   INFLUENZA VACCINE  07/01/2023 (Originally 11/01/2022)   Medicare Annual Wellness (AWV)  04/11/2024   DEXA SCAN  06/14/2024   DTaP/Tdap/Td (2 - Td or Tdap) 06/01/2025   Colonoscopy  07/02/2032   Hepatitis C Screening  Completed   HPV VACCINES  Aged Out    Health Maintenance  Health Maintenance Due  Topic Date Due   Zoster Vaccines- Shingrix (1 of 2) Never done   Pneumonia Vaccine 42+ Years old (1 of 1 - PCV) Never done   MAMMOGRAM  11/25/2022   COVID-19 Vaccine (3 - 2024-25 season) 12/02/2022    Colorectal cancer screening: Type of screening: Colonoscopy. Completed 07/03/22. Repeat every 10 years  Mammogram status: Ordered today. Pt provided with contact info and advised to call to schedule appt.   Bone Density status: Completed 06/15/22. Results reflect: Bone density results: OSTEOPENIA. Repeat every 2 years.  Lung Cancer Screening: (Low Dose CT Chest recommended if Age 76-80 years, 20 pack-year currently smoking OR have quit w/in 15years.) does not qualify.   Lung Cancer Screening Referral: n/a  Additional Screening:  Hepatitis C Screening: does qualify; Completed 06/02/15  Vision Screening: Recommended annual ophthalmology exams for early detection of glaucoma and other disorders of the eye. Is the patient up to  date with their annual eye exam?  Yes  Who is the provider or what is the name of the office in which the patient attends annual eye exams? Dr. Willma Moats If pt is not established with a provider, would they like to be referred to a provider to establish care? No .   Dental Screening: Recommended annual dental exams for proper oral hygiene  Community Resource Referral / Chronic Care Management: CRR required this visit?  No   CCM required this visit?  No     Plan:     I have personally reviewed and noted the following in the patient's chart:   Medical and social history Use of alcohol, tobacco or illicit drugs  Current medications and supplements including opioid prescriptions. Patient is not currently taking opioid prescriptions. Functional ability and status Nutritional status Physical activity Advanced directives List of other physicians Hospitalizations, surgeries, and ER visits in previous 12 months Vitals Screenings to include cognitive, depression, and falls Referrals and appointments  In addition, I have reviewed and discussed with patient certain preventive protocols, quality metrics, and best practice recommendations. A written personalized care plan for preventive services as well as general preventive health recommendations were provided to patient.     Janice Long,  Janice Browner, Long   [birth year]74   After Visit Summary: (MyChart) Due to this being a telephonic visit, the after visit summary with patients personalized plan was offered to patient via MyChart   Nurse Notes: No concerns at this time

## 2023-04-12 NOTE — Patient Instructions (Signed)
 Ms. Janice Long , Thank you for taking time to come for your Medicare Wellness Visit. I appreciate your ongoing commitment to your health goals. Please review the following plan we discussed and let me know if I can assist you in the future.   Referrals/Orders/Follow-Ups/Clinician Recommendations: Aim for 30 minutes of exercise or brisk walking, 6-8 glasses of water, and 5 servings of fruits and vegetables each day.  This is a list of the screening recommended for you and due dates:  Health Maintenance  Topic Date Due   Zoster (Shingles) Vaccine (1 of 2) Never done   Pneumonia Vaccine (1 of 1 - PCV) Never done   Mammogram  11/25/2022   COVID-19 Vaccine (3 - 2024-25 season) 12/02/2022   Flu Shot  07/01/2023*   Medicare Annual Wellness Visit  04/11/2024   DEXA scan (bone density measurement)  06/14/2024   DTaP/Tdap/Td vaccine (2 - Td or Tdap) 06/01/2025   Colon Cancer Screening  07/02/2032   Hepatitis C Screening  Completed   HPV Vaccine  Aged Out  *Topic was postponed. The date shown is not the original due date.    Advanced directives: (ACP Link)Information on Advanced Care Planning can be found at   Secretary of [facility] Advance Health Care Directives Advance Health Care Directives ([email or web address]   Next Medicare Annual Wellness Visit scheduled for next year: Yes

## 2023-04-19 NOTE — Patient Instructions (Signed)
Our records indicate that you are due for your screening mammogram.  Please call the imaging center that does your yearly mammograms to make an appointment for a mammogram at your earliest convenience. Our office also has a mobile unit through the [facility] that comes to our location. Please call our office if you would like to make an appointment.   

## 2023-04-23 ENCOUNTER — Ambulatory Visit (INDEPENDENT_AMBULATORY_CARE_PROVIDER_SITE_OTHER): Payer: Medicare Other | Admitting: Nurse Practitioner

## 2023-04-23 ENCOUNTER — Encounter: Payer: Self-pay | Admitting: Nurse Practitioner

## 2023-04-23 ENCOUNTER — Ambulatory Visit: Payer: Medicare Other

## 2023-04-23 VITALS — BP 127/74 | HR 90 | Temp 97.8°F | Ht 62.0 in | Wt 149.2 lb

## 2023-04-23 DIAGNOSIS — M85851 Other specified disorders of bone density and structure, right thigh: Secondary | ICD-10-CM | POA: Diagnosis not present

## 2023-04-23 DIAGNOSIS — K219 Gastro-esophageal reflux disease without esophagitis: Secondary | ICD-10-CM

## 2023-04-23 DIAGNOSIS — Z6826 Body mass index (BMI) 26.0-26.9, adult: Secondary | ICD-10-CM

## 2023-04-23 DIAGNOSIS — E785 Hyperlipidemia, unspecified: Secondary | ICD-10-CM

## 2023-04-23 DIAGNOSIS — E559 Vitamin D deficiency, unspecified: Secondary | ICD-10-CM

## 2023-04-23 DIAGNOSIS — M85852 Other specified disorders of bone density and structure, left thigh: Secondary | ICD-10-CM

## 2023-04-23 LAB — LIPID PANEL

## 2023-04-23 MED ORDER — PANTOPRAZOLE SODIUM 40 MG PO TBEC: 40.0000 mg | DELAYED_RELEASE_TABLET | Freq: Every day | ORAL | 1 refills | Status: DC

## 2023-04-23 MED ORDER — ATORVASTATIN CALCIUM 20 MG PO TABS
20.0000 mg | ORAL_TABLET | Freq: Every day | ORAL | 1 refills | Status: DC
Start: 2023-04-23 — End: 2023-10-18

## 2023-04-23 NOTE — Progress Notes (Signed)
Subjective:    Patient ID: Janice Long, female    DOB: 03-01-1956, 68 y.o.   MRN: [id number]   Chief Complaint: medical management of chronic issues     HPI:  Janice Long is a 68 y.o. who identifies as a female who was assigned female at birth.   Social history: Lives with: husband Work history: retired from [employer] in today for follow up of the following chronic medical issues:  1. Hyperlipidemia with target LDL less than 100 Does try to watch diet and does no dedicated exercise. Lab Results  Component Value Date   CHOL 155 04/06/2022   HDL 69 04/06/2022   LDLCALC 75 04/06/2022   TRIG 55 04/06/2022   CHOLHDL 2.2 04/06/2022   The 10-year ASCVD risk score (Arnett DK, et al., 2019) is: 6.4%   2. Osteopenia of necks of both femurs Last dexascan was done on 06/14/20. Her t score was -1.2. does not weight bearing exercise.  3. Vitamin D deficiency Takes a daily multivitamin  4. GERD Takes protonix daily and is doing well   5.  BMI 26.0-26.9, Weight is up 6lbs  Wt Readings from Last 3 Encounters:  04/23/23 149 lb 3.2 oz (67.7 kg)  04/12/23 146 lb (66.2 kg)  07/03/22 146 lb (66.2 kg)   BMI Readings from Last 3 Encounters:  04/23/23 27.29 kg/m  04/12/23 26.70 kg/m  07/03/22 26.70 kg/m       New complaints: None today  No Known Allergies Outpatient Encounter Medications as of 04/23/2023  Medication Sig   albuterol (VENTOLIN HFA) 108 (90 Base) MCG/ACT inhaler INHALE 1-2 PUFFS BY MOUTH EVERY 6 HOURS AS NEEDED FOR WHEEZE OR SHORTNESS OF BREATH   Ascorbic Acid (VITAMIN C) 500 MG CAPS Take by mouth daily.   atorvastatin (LIPITOR) 20 MG tablet TAKE 1 TABLET BY MOUTH EVERY DAY   CALCIUM PO Take by mouth daily. TAKE ONE   Multiple Vitamin (MULTIVITAMIN WITH MINERALS) TABS tablet Take 1 tablet by mouth daily.   Nutritional Supplements (VITAMIN D BOOSTER PO) Take by mouth daily.   pantoprazole (PROTONIX) 40 MG tablet TAKE 1 TABLET BY  MOUTH EVERY DAY   triamcinolone cream (KENALOG) 0.5 % Apply 1 application topically 3 (three) times daily. (Patient taking differently: Apply 1 application  topically as needed.)   No facility-administered encounter medications on file as of 04/23/2023.    Past Surgical History:  Procedure Laterality Date   COLONOSCOPY     TUBAL LIGATION      Family History  Problem Relation Age of Onset   Cancer Mother        cervical, bladder, tumor on urethra   Colon polyps Father    Hyperlipidemia Father    Dementia Father    Parkinson's disease Father    Colon cancer Neg Hx    Crohn's disease Neg Hx    Esophageal cancer Neg Hx    Rectal cancer Neg Hx    Stomach cancer Neg Hx    Ulcerative colitis Neg Hx       Controlled substance contract: n/a     Review of Systems  Constitutional:  Negative for diaphoresis.  Eyes:  Negative for pain.  Respiratory:  Negative for shortness of breath.   Cardiovascular:  Negative for chest pain, palpitations and leg swelling.  Gastrointestinal:  Negative for abdominal pain.  Endocrine: Negative for polydipsia.  Skin:  Negative for rash.  Neurological:  Negative for dizziness, weakness and headaches.  Hematological:  Does not bruise/bleed easily.  All other systems reviewed and are negative.      Objective:   Physical Exam Vitals and nursing note reviewed.  Constitutional:      General: She is not in acute distress.    Appearance: Normal appearance. She is well-developed.  HENT:     Head: Normocephalic.     Right Ear: Tympanic membrane normal.     Left Ear: Tympanic membrane normal.     Nose: Nose normal.     Mouth/Throat:     Mouth: Mucous membranes are moist.  Eyes:     Pupils: Pupils are equal, round, and reactive to light.  Neck:     Vascular: No carotid bruit or JVD.  Cardiovascular:     Rate and Rhythm: Normal rate and regular rhythm.     Heart sounds: Normal heart sounds.  Pulmonary:     Effort: Pulmonary effort is normal.  No respiratory distress.     Breath sounds: Normal breath sounds. No wheezing or rales.  Chest:     Chest wall: No tenderness.  Abdominal:     General: Bowel sounds are normal. There is no distension or abdominal bruit.     Palpations: Abdomen is soft. There is no hepatomegaly, splenomegaly, mass or pulsatile mass.     Tenderness: There is no abdominal tenderness.  Genitourinary:    General: Normal vulva.     Vagina: No vaginal discharge.     Rectum: Normal.     Comments: Cervix parous and pink No adnexal masses or tenderness Musculoskeletal:        General: Normal range of motion.     Cervical back: Normal range of motion and neck supple.  Lymphadenopathy:     Cervical: No cervical adenopathy.  Skin:    General: Skin is warm and dry.  Neurological:     Mental Status: She is alert and oriented to person, place, and time.     Deep Tendon Reflexes: Reflexes are normal and symmetric.  Psychiatric:        Behavior: Behavior normal.        Thought Content: Thought content normal.        Judgment: Judgment normal.     BP 127/74   Pulse 90   Temp 97.8 F (36.6 C) (Skin)   Ht 5\' 2"  (1.575 m)   Wt 149 lb 3.2 oz (67.7 kg)   BMI 27.29 kg/m         Assessment & Plan:   Janice Long comes in today with chief complaint of No chief complaint on file.   Diagnosis and orders addressed:  1. Hyperlipidemia with target LDL less than 100 Low fat diet - CBC with Differential/Platelet - Lipid panel - atorvastatin (LIPITOR) 20 MG tablet; Take 1 tablet (20 mg total) by mouth daily.  Dispense: 90 tablet; Refill: 1  2. Osteopenia of necks of both femurs Weight bearing exercises  3. Vitamin D deficiency Continue vitamin dsupplement - VITAMIN D 25 Hydroxy (Vit-D Deficiency, Fractures)  4. BMI 26.0-26.9,adult Discussed diet and exercise for person with BMI >25 Will recheck weight in 3-6 months   5. Gastroesophageal reflux disease without esophagitis Avoid spicy foods Do  not eat 2 hours prior to bedtime  - pantoprazole (PROTONIX) 40 MG tablet; Take 1 tablet (40 mg total) by mouth daily.  Dispense: 90 tablet; Refill: 1   Labs pending Health Maintenance reviewed Diet and exercise encouraged  Follow up plan: 6 months   [name], FNP

## 2023-04-24 LAB — CBC WITH DIFFERENTIAL/PLATELET
Basophils Absolute: 0.1 10*3/uL (ref 0.0–0.2)
Basos: 1 %
EOS (ABSOLUTE): 1 10*3/uL — ABNORMAL HIGH (ref 0.0–0.4)
Eos: 13 %
Hematocrit: 43.5 % (ref 34.0–46.6)
Hemoglobin: 14.4 g/dL (ref 11.1–15.9)
Immature Grans (Abs): 0 10*3/uL (ref 0.0–0.1)
Immature Granulocytes: 0 %
Lymphocytes Absolute: 1.8 10*3/uL (ref 0.7–3.1)
Lymphs: 23 %
MCH: 30.2 pg (ref 26.6–33.0)
MCHC: 33.1 g/dL (ref 31.5–35.7)
MCV: 91 fL (ref 79–97)
Monocytes Absolute: 0.8 10*3/uL (ref 0.1–0.9)
Monocytes: 10 %
Neutrophils Absolute: 4.2 10*3/uL (ref 1.4–7.0)
Neutrophils: 53 %
Platelets: 275 10*3/uL (ref 150–450)
RBC: 4.77 x10E6/uL (ref 3.77–5.28)
RDW: 13.1 % (ref 11.7–15.4)
WBC: 7.9 10*3/uL (ref 3.4–10.8)

## 2023-04-24 LAB — CMP14+EGFR
ALT: 24 [IU]/L (ref 0–32)
AST: 26 [IU]/L (ref 0–40)
Albumin: 4.3 g/dL (ref 3.9–4.9)
Alkaline Phosphatase: 86 [IU]/L (ref 44–121)
BUN/Creatinine Ratio: 9 — ABNORMAL LOW (ref 12–28)
BUN: 9 mg/dL (ref 8–27)
Bilirubin Total: 0.7 mg/dL (ref 0.0–1.2)
CO2: 24 mmol/L (ref 20–29)
Calcium: 9.8 mg/dL (ref 8.7–10.3)
Chloride: 105 mmol/L (ref 96–106)
Creatinine, Ser: 0.96 mg/dL (ref 0.57–1.00)
Globulin, Total: 2.4 g/dL (ref 1.5–4.5)
Glucose: 100 mg/dL — ABNORMAL HIGH (ref 70–99)
Potassium: 4.4 mmol/L (ref 3.5–5.2)
Sodium: 142 mmol/L (ref 134–144)
Total Protein: 6.7 g/dL (ref 6.0–8.5)
eGFR: 64 mL/min/{1.73_m2} (ref >59)

## 2023-04-24 LAB — LIPID PANEL
Cholesterol, Total: 162 mg/dL (ref 100–199)
HDL: 59 mg/dL (ref >39)
LDL CALC COMMENT:: 2.7 ratio (ref 0.0–4.4)
LDL Chol Calc (NIH): 81 mg/dL (ref 0–99)
Triglycerides: 127 mg/dL (ref 0–149)
VLDL Cholesterol Cal: 22 mg/dL (ref 5–40)

## 2023-10-11 ENCOUNTER — Ambulatory Visit: Payer: Self-pay

## 2023-10-11 NOTE — Telephone Encounter (Signed)
 FYI Only or Action Required?: FYI only for provider.  Patient was last seen in primary care on 04/23/2023 by Janice Mustard, FNP.  Called Nurse Triage reporting Vaginal Pain.  Symptoms began several months ago.  Interventions attempted: OTC medications: vagasil.  Symptoms are: unchanged.  Triage Disposition: See PCP When Office is Open (Within 3 Days)  Patient/caregiver understands and will follow disposition?: Yes            Copied from CRM [id number]. Topic: Clinical - Red Word Triage >> Oct 11, 2023 10:43 AM Carlatta H wrote: [facility] that prompted transfer to Nurse Triage: Patient is having a lot of vaginal pain with yeast symptoms//Symptoms for a few months// Reason for Disposition  [1] Vulvar itching AND [2] not improved > 3 days following Care Advice  Answer Assessment - Initial Assessment Questions 1. SYMPTOM: What's the main symptom you're concerned about? (e.g., pain, itching, dryness)     itching 2. LOCATION: Where is the  itching located? (e.g., inside/outside, left/right)     outside 3. ONSET: When did the  itching  start?     6 months 4. PAIN: Is there any pain? If Yes, ask: How bad is it? (Scale: 1-10; mild, moderate, severe)     Mild from the itching 5. ITCHING: Is there any itching? If Yes, ask: How bad is it? (Scale: 1-10; mild, moderate, severe)     10/10 when in the bed at night 6. CAUSE: What do you think is causing the discharge? Have you had the same problem before? What happened then?     no 7. OTHER SYMPTOMS: Do you have any other symptoms? (e.g., fever, itching, vaginal bleeding, pain with urination, injury to genital area, vaginal foreign body)     Rectal itching,  Protocols used: Vaginal Symptoms-A-AH, Vulvar Symptoms-A-AH

## 2023-10-11 NOTE — Telephone Encounter (Signed)
 Patient scheduled to be seen 07/14.

## 2023-10-14 ENCOUNTER — Ambulatory Visit (INDEPENDENT_AMBULATORY_CARE_PROVIDER_SITE_OTHER): Admitting: Nurse Practitioner

## 2023-10-14 ENCOUNTER — Encounter: Payer: Self-pay | Admitting: Nurse Practitioner

## 2023-10-14 VITALS — BP 105/63 | HR 90 | Temp 97.6°F | Ht 62.0 in | Wt 154.0 lb

## 2023-10-14 DIAGNOSIS — N904 Leukoplakia of vulva: Secondary | ICD-10-CM | POA: Diagnosis not present

## 2023-10-14 MED ORDER — CLOBETASOL PROPIONATE 0.05 % EX GEL: 1.0000 | Freq: Two times a day (BID) | CUTANEOUS | 3 refills | Status: DC

## 2023-10-14 MED ORDER — PREDNISONE 20 MG PO TABS
40.0000 mg | ORAL_TABLET | Freq: Every day | ORAL | 0 refills | Status: AC
Start: 2023-10-14 — End: 2023-10-19

## 2023-10-14 NOTE — Patient Instructions (Signed)
 Itchy, Painful, White Skin Patches (Lichen Sclerosus): What to Know Lichen sclerosus is a skin problem. It can happen on any part of your body. It happens most often in the areas around your genitals or the opening of your butt (anus). Treatment can help to control symptoms. It can also help prevent scarring that may lead to other problems. This skin problem isn't an infection or a fungus. It can't be passed from person to person. What are the causes? The cause of this condition isn't known. It may be related to: The body's defense system, also called the immune system, reacting too strongly. A lack of certain hormones. What increases the risk? Being a female who has stopped having periods. This is called menopause. Being a female who wasn't circumcised. Being a child who hasn't reached puberty yet. What are the signs or symptoms?  Thin, wrinkled, white areas on the skin. Thickened white areas on the skin. Red and swollen patches on the skin. Tears or cracks in the skin. Bruising. Blood blisters. Very bad itching. Pain, itching, or burning when peeing. Trouble pooping, especially in children. How is this treated? This condition may be treated with: Topical steroids. These are creams or ointments that are put on the skin. Medicines that are taken by mouth. Topical immunotherapy. These are creams and ointments that you put on the skin to give your body's defense system a boost. Surgery. This may need to be done if there are problems like scarring. Follow these instructions at home: Medicines Take or apply your medicines only as told. Use creams or ointments as told by your doctor. Skin care Do not scratch the affected areas. Keep the affected areas clean and dry. Clean the affected area gently with water only. Avoid using rough towels or toilet paper. Avoid products that irritate the skin. These include scented soaps, lotions, and bubble bath. Use thick creams or ointments to lessen  itching as told. General instructions Your condition may cause trouble pooping. To help prevent or treat this, you may need to: Take medicines to help you poop. Eat foods high in fiber, like beans, whole grains, and fresh fruits and vegetables. Drink more fluids as told. Keep all follow-up visits. This helps make sure the treatment plan is working. Contact a doctor if: You have more redness, swelling, or pain. You have fluid, blood, or pus coming from the area. You have new patches on your skin. You have a fever. You have pain during sex. This information is not intended to replace advice given to you by your health care provider. Make sure you discuss any questions you have with your health care provider. Document Revised: 10/23/2022 Document Reviewed: 10/23/2022 Elsevier Patient Education  2024 [employer].

## 2023-10-14 NOTE — Progress Notes (Signed)
 Subjective:    Patient ID: Janice Long, female    DOB: [birth year], 68 y.o.   MRN: [id number]   Chief Complaint: vaginal burning and itching  HPI  Patient has history of lichen sclerosus of vulva perinem and rectal area.  Has gotten worse and is itching so bad.  Patient Active Problem List   Diagnosis Date Noted   Osteopenia 06/13/2015   Vitamin D  deficiency 06/02/2015   BMI 26.0-26.9,adult 06/02/2015   Lichen sclerosus of female genitalia 06/02/2015   Hyperlipidemia with target LDL less than 100 05/04/2013       Review of Systems  Constitutional:  Negative for diaphoresis.  Eyes:  Negative for pain.  Respiratory:  Negative for shortness of breath.   Cardiovascular:  Negative for chest pain, palpitations and leg swelling.  Gastrointestinal:  Negative for abdominal pain.  Endocrine: Negative for polydipsia.  Skin:  Negative for rash.  Neurological:  Negative for dizziness, weakness and headaches.  Hematological:  Does not bruise/bleed easily.  All other systems reviewed and are negative.      Objective:   Physical Exam Vitals and nursing note reviewed.  Constitutional:      General: She is not in acute distress.    Appearance: Normal appearance. She is well-developed.  Neck:     Vascular: No carotid bruit or JVD.  Cardiovascular:     Rate and Rhythm: Normal rate and regular rhythm.     Heart sounds: Normal heart sounds.  Pulmonary:     Effort: Pulmonary effort is normal. No respiratory distress.     Breath sounds: Normal breath sounds. No wheezing or rales.  Chest:     Chest wall: No tenderness.  Abdominal:     General: Bowel sounds are normal. There is no distension or abdominal bruit.     Palpations: Abdomen is soft. There is no hepatomegaly, splenomegaly, mass or pulsatile mass.     Tenderness: There is no abdominal tenderness.  Musculoskeletal:        General: Normal range of motion.     Cervical back: Normal range of motion and neck supple.   Lymphadenopathy:     Cervical: No cervical adenopathy.  Skin:    General: Skin is warm and dry.  Neurological:     Mental Status: She is alert and oriented to person, place, and time.     Deep Tendon Reflexes: Reflexes are normal and symmetric.  Psychiatric:        Behavior: Behavior normal.        Thought Content: Thought content normal.        Judgment: Judgment normal.    BP 105/63   Pulse 90   Temp 97.6 F (36.4 C) (Temporal)   Ht 5' 2 (1.575 m)   Wt 154 lb (69.9 kg)   SpO2 95%   BMI 28.17 kg/m         Assessment & Plan:   Janice Long in today with chief complaint of No chief complaint on file.   1. Lichen sclerosus of female genitalia (Primary) Avoid scratching No harsh soap- aveeno is best Avoid excessive hot water RTO prn - clobetasol  (TEMOVATE ) 0.05 % GEL; Apply 1 Application topically 2 (two) times daily.  Dispense: 60 g; Refill: 3 - predniSONE  (DELTASONE ) 20 MG tablet; Take 2 tablets (40 mg total) by mouth daily with breakfast for 5 days. 2 po daily for 5 days  Dispense: 10 tablet; Refill: 0    The above assessment and management plan was  discussed with the patient. The patient verbalized understanding of and has agreed to the management plan. Patient is aware to call the clinic if symptoms persist or worsen. Patient is aware when to return to the clinic for a follow-up visit. Patient educated on when it is appropriate to go to the emergency department.   Mary-Margaret Gladis, FNP

## 2023-10-18 ENCOUNTER — Other Ambulatory Visit: Payer: Self-pay | Admitting: Nurse Practitioner

## 2023-10-18 DIAGNOSIS — E785 Hyperlipidemia, unspecified: Secondary | ICD-10-CM

## 2023-10-24 ENCOUNTER — Other Ambulatory Visit: Payer: Self-pay | Admitting: Nurse Practitioner

## 2023-10-24 DIAGNOSIS — K219 Gastro-esophageal reflux disease without esophagitis: Secondary | ICD-10-CM

## 2023-10-28 ENCOUNTER — Ambulatory Visit: Payer: Medicare Other

## 2023-10-28 ENCOUNTER — Encounter: Payer: Self-pay | Admitting: Nurse Practitioner

## 2023-10-28 ENCOUNTER — Ambulatory Visit (INDEPENDENT_AMBULATORY_CARE_PROVIDER_SITE_OTHER): Payer: Medicare Other | Admitting: Nurse Practitioner

## 2023-10-28 VITALS — BP 106/73 | HR 73 | Temp 97.4°F | Ht 62.0 in | Wt 153.0 lb

## 2023-10-28 DIAGNOSIS — E785 Hyperlipidemia, unspecified: Secondary | ICD-10-CM

## 2023-10-28 DIAGNOSIS — Z6826 Body mass index (BMI) 26.0-26.9, adult: Secondary | ICD-10-CM

## 2023-10-28 DIAGNOSIS — E559 Vitamin D deficiency, unspecified: Secondary | ICD-10-CM

## 2023-10-28 DIAGNOSIS — N904 Leukoplakia of vulva: Secondary | ICD-10-CM | POA: Diagnosis not present

## 2023-10-28 DIAGNOSIS — K219 Gastro-esophageal reflux disease without esophagitis: Secondary | ICD-10-CM | POA: Diagnosis not present

## 2023-10-28 DIAGNOSIS — M85852 Other specified disorders of bone density and structure, left thigh: Secondary | ICD-10-CM

## 2023-10-28 DIAGNOSIS — M85851 Other specified disorders of bone density and structure, right thigh: Secondary | ICD-10-CM

## 2023-10-28 LAB — LIPID PANEL

## 2023-10-28 NOTE — Progress Notes (Signed)
 Subjective:    Patient ID: Janice Long, female    DOB: October 13, 1955, 68 y.o.   MRN: [id number]   Chief Complaint: medical management of chronic issues     HPI:  Janice Long is a 68 y.o. who identifies as a female who was assigned female at birth.   Social history: Lives with: husband Work history: retired from [employer] in today for follow up of the following chronic medical issues:  1. Hyperlipidemia with target LDL less than 100 Does try to watch diet and does no dedicated exercise. Lab Results  Component Value Date   CHOL 162 04/23/2023   HDL 59 04/23/2023   LDLCALC 81 04/23/2023   TRIG 127 04/23/2023   CHOLHDL 2.7 04/23/2023   The 10-year ASCVD risk score (Arnett DK, et al., 2019) is: 4.8%   2. Osteopenia of necks of both femurs Last dexascan was done on 06/14/20. Her t score was -1.2. does not weight bearing exercise.  3. Vitamin D  deficiency Takes a daily multivitamin  4. GERD Takes protonix  daily and is doing well   5.  Lichen sclerosus of vulva Was given steroids and clobetasol  at last visit and she says it has improved some. Still has itching daily.   6. BMI 26.0-26.9, Weight is up 6lbs  Wt Readings from Last 3 Encounters:  10/28/23 153 lb (69.4 kg)  10/14/23 154 lb (69.9 kg)  04/23/23 149 lb 3.2 oz (67.7 kg)   BMI Readings from Last 3 Encounters:  10/28/23 27.98 kg/m  10/14/23 28.17 kg/m  04/23/23 27.29 kg/m         New complaints: None today  No Known Allergies Outpatient Encounter Medications as of 10/28/2023  Medication Sig   albuterol  (VENTOLIN  HFA) 108 (90 Base) MCG/ACT inhaler INHALE 1-2 PUFFS BY MOUTH EVERY 6 HOURS AS NEEDED FOR WHEEZE OR SHORTNESS OF BREATH   Ascorbic Acid (VITAMIN C) 500 MG CAPS Take by mouth daily.   atorvastatin  (LIPITOR) 20 MG tablet TAKE 1 TABLET BY MOUTH EVERY DAY   CALCIUM  PO Take by mouth daily. TAKE ONE   clobetasol  (TEMOVATE ) 0.05 % GEL Apply 1 Application topically 2 (two)  times daily.   Multiple Vitamin (MULTIVITAMIN WITH MINERALS) TABS tablet Take 1 tablet by mouth daily.   Nutritional Supplements (VITAMIN D  BOOSTER PO) Take by mouth daily.   pantoprazole  (PROTONIX ) 40 MG tablet TAKE 1 TABLET BY MOUTH EVERY DAY   No facility-administered encounter medications on file as of 10/28/2023.    Past Surgical History:  Procedure Laterality Date   COLONOSCOPY     TUBAL LIGATION      Family History  Problem Relation Age of Onset   Cancer Mother        cervical, bladder, tumor on urethra   Colon polyps Father    Hyperlipidemia Father    Dementia Father    Parkinson's disease Father    Colon cancer Neg Hx    Crohn's disease Neg Hx    Esophageal cancer Neg Hx    Rectal cancer Neg Hx    Stomach cancer Neg Hx    Ulcerative colitis Neg Hx       Controlled substance contract: n/a     Review of Systems  Constitutional:  Negative for diaphoresis.  Eyes:  Negative for pain.  Respiratory:  Negative for shortness of breath.   Cardiovascular:  Negative for chest pain, palpitations and leg swelling.  Gastrointestinal:  Negative for abdominal pain.  Endocrine: Negative for  polydipsia.  Skin:  Negative for rash.  Neurological:  Negative for dizziness, weakness and headaches.  Hematological:  Does not bruise/bleed easily.  All other systems reviewed and are negative.      Objective:   Physical Exam Vitals and nursing note reviewed.  Constitutional:      General: She is not in acute distress.    Appearance: Normal appearance. She is well-developed.  HENT:     Head: Normocephalic.     Right Ear: Tympanic membrane normal.     Left Ear: Tympanic membrane normal.     Nose: Nose normal.     Mouth/Throat:     Mouth: Mucous membranes are moist.  Eyes:     Pupils: Pupils are equal, round, and reactive to light.  Neck:     Vascular: No carotid bruit or JVD.  Cardiovascular:     Rate and Rhythm: Normal rate and regular rhythm.     Heart sounds: Normal  heart sounds.  Pulmonary:     Effort: Pulmonary effort is normal. No respiratory distress.     Breath sounds: Normal breath sounds. No wheezing or rales.  Chest:     Chest wall: No tenderness.  Abdominal:     General: Bowel sounds are normal. There is no distension or abdominal bruit.     Palpations: Abdomen is soft. There is no hepatomegaly, splenomegaly, mass or pulsatile mass.     Tenderness: There is no abdominal tenderness.  Genitourinary:    Vagina: No vaginal discharge.  Musculoskeletal:        General: Normal range of motion.     Cervical back: Normal range of motion and neck supple.  Lymphadenopathy:     Cervical: No cervical adenopathy.  Skin:    General: Skin is warm and dry.  Neurological:     Mental Status: She is alert and oriented to person, place, and time.     Deep Tendon Reflexes: Reflexes are normal and symmetric.  Psychiatric:        Behavior: Behavior normal.        Thought Content: Thought content normal.        Judgment: Judgment normal.     BP 106/73   Pulse 73   Temp (!) 97.4 F (36.3 C) (Temporal)   Ht 5' 2 (1.575 m)   Wt 153 lb (69.4 kg)   SpO2 93%   BMI 27.98 kg/m          Assessment & Plan:   Janice Long comes in today with chief complaint of Medical Management of Chronic Issues   Diagnosis and orders addressed:  1. Hyperlipidemia with target LDL less than 100 (Primary) Low fat diet  2. Lichen sclerosus of female genitalia Avoid hot baths  3. Vitamin D  deficiency Continue daily vitamin d  supplement  4. Gastroesophageal reflux disease without esophagitis Avoid spicy foods Do not eat 2 hours prior to bedtime   5. Osteopenia of necks of both femurs Weight bearing exercises  6. BMI 26.0-26.9,adult Discussed diet and exercise for person with BMI >25 Will recheck weight in 3-6 months    Labs pending Health Maintenance reviewed Diet and exercise encouraged  Follow up plan: 6 months   Mary-Margaret Gladis,  FNP

## 2023-10-29 ENCOUNTER — Ambulatory Visit: Payer: Self-pay | Admitting: Nurse Practitioner

## 2023-10-29 LAB — CBC WITH DIFFERENTIAL/PLATELET
Basophils Absolute: 0.1 x10E3/uL (ref 0.0–0.2)
Basos: 1 %
EOS (ABSOLUTE): 0.6 x10E3/uL — ABNORMAL HIGH (ref 0.0–0.4)
Eos: 6 %
Hematocrit: 45.6 % (ref 34.0–46.6)
Hemoglobin: 14.7 g/dL (ref 11.1–15.9)
Immature Grans (Abs): 0 x10E3/uL (ref 0.0–0.1)
Immature Granulocytes: 0 %
Lymphocytes Absolute: 2.1 x10E3/uL (ref 0.7–3.1)
Lymphs: 21 %
MCH: 29.9 pg (ref 26.6–33.0)
MCHC: 32.2 g/dL (ref 31.5–35.7)
MCV: 93 fL (ref 79–97)
Monocytes Absolute: 1 x10E3/uL — ABNORMAL HIGH (ref 0.1–0.9)
Monocytes: 9 %
Neutrophils Absolute: 6.4 x10E3/uL (ref 1.4–7.0)
Neutrophils: 63 %
Platelets: 275 x10E3/uL (ref 150–450)
RBC: 4.91 x10E6/uL (ref 3.77–5.28)
RDW: 13.2 % (ref 11.7–15.4)
WBC: 10.2 x10E3/uL (ref 3.4–10.8)

## 2023-10-29 LAB — CMP14+EGFR
ALT: 25 IU/L (ref 0–32)
AST: 21 IU/L (ref 0–40)
Albumin: 4.1 g/dL (ref 3.9–4.9)
Alkaline Phosphatase: 74 IU/L (ref 44–121)
BUN/Creatinine Ratio: 11 — ABNORMAL LOW (ref 12–28)
BUN: 11 mg/dL (ref 8–27)
Bilirubin Total: 0.8 mg/dL (ref 0.0–1.2)
CO2: 23 mmol/L (ref 20–29)
Calcium: 10 mg/dL (ref 8.7–10.3)
Chloride: 104 mmol/L (ref 96–106)
Creatinine, Ser: 1.03 mg/dL — ABNORMAL HIGH (ref 0.57–1.00)
Globulin, Total: 2.4 g/dL (ref 1.5–4.5)
Glucose: 104 mg/dL — ABNORMAL HIGH (ref 70–99)
Potassium: 5 mmol/L (ref 3.5–5.2)
Sodium: 144 mmol/L (ref 134–144)
Total Protein: 6.5 g/dL (ref 6.0–8.5)
eGFR: 59 mL/min/1.73 — ABNORMAL LOW (ref >59)

## 2023-10-29 LAB — LIPID PANEL
Cholesterol, Total: 165 mg/dL (ref 100–199)
HDL: 67 mg/dL (ref >39)
LDL CALC COMMENT:: 2.5 ratio (ref 0.0–4.4)
LDL Chol Calc (NIH): 78 mg/dL (ref 0–99)
Triglycerides: 114 mg/dL (ref 0–149)
VLDL Cholesterol Cal: 20 mg/dL (ref 5–40)

## 2023-11-06 ENCOUNTER — Other Ambulatory Visit (INDEPENDENT_AMBULATORY_CARE_PROVIDER_SITE_OTHER)

## 2023-11-06 DIAGNOSIS — M8588 Other specified disorders of bone density and structure, other site: Secondary | ICD-10-CM | POA: Diagnosis not present

## 2023-11-08 ENCOUNTER — Ambulatory Visit: Payer: Self-pay | Admitting: Nurse Practitioner

## 2023-11-27 ENCOUNTER — Ambulatory Visit
Admission: RE | Admit: 2023-11-27 | Discharge: 2023-11-27 | Disposition: A | Discharge status: Home / Self Care | Payer: Medicare Other | Source: Ambulatory Visit | Attending: Nurse Practitioner | Admitting: Nurse Practitioner

## 2023-11-27 DIAGNOSIS — Z1231 Encounter for screening mammogram for malignant neoplasm of breast: Secondary | ICD-10-CM

## 2024-01-22 ENCOUNTER — Other Ambulatory Visit: Payer: Self-pay | Admitting: Nurse Practitioner

## 2024-01-22 DIAGNOSIS — K219 Gastro-esophageal reflux disease without esophagitis: Secondary | ICD-10-CM

## 2024-01-22 DIAGNOSIS — E785 Hyperlipidemia, unspecified: Secondary | ICD-10-CM

## 2024-04-13 ENCOUNTER — Ambulatory Visit: Payer: Medicare Other

## 2024-04-13 VITALS — BP 106/73 | HR 73 | Ht 62.0 in | Wt 153.0 lb

## 2024-04-13 DIAGNOSIS — Z0000 Encounter for general adult medical examination without abnormal findings: Secondary | ICD-10-CM | POA: Diagnosis not present

## 2024-04-13 NOTE — Progress Notes (Signed)
 "  Chief Complaint  Patient presents with   Medicare Wellness     Subjective:   Janice Long is a 69 y.o. female who presents for a Medicare Annual Wellness Visit.  Visit info / Clinical Intake: Medicare Wellness Visit Type:: Subsequent Annual Wellness Visit Persons participating in visit and providing information:: patient Medicare Wellness Visit Mode:: Telephone If telephone:: video declined Since this visit was completed virtually, some vitals may be partially provided or unavailable. Missing vitals are due to the limitations of the virtual format.: Unable to obtain vitals - no equipment If Telephone or Video please confirm:: I connected with patient using audio/video enable telemedicine. I verified patient identity with two identifiers, discussed telehealth limitations, and patient agreed to proceed. Patient Location:: home Provider Location:: office Interpreter Needed?: No Pre-visit prep was completed: yes AWV questionnaire completed by patient prior to visit?: no Living arrangements:: lives with spouse/significant other Patient's Overall Health Status Rating: very good Typical amount of pain: none Does pain affect daily life?: no Are you currently prescribed opioids?: no  Dietary Habits and Nutritional Risks How many meals a day?: 2 Eats fruit and vegetables daily?: yes Most meals are obtained by: preparing own meals In the last 2 weeks, have you had any of the following?: none Diabetic:: no  Functional Status Activities of Daily Living (to include ambulation/medication): Independent Ambulation: Independent Medication Administration: Independent Home Management (perform basic housework or laundry): Independent Manage your own finances?: yes Primary transportation is: driving Concerns about vision?: no *vision screening is required for WTM* (last ov 10/25) Concerns about hearing?: no  Fall Screening Falls in the past year?: 0 Number of falls in past year: 0 Was  there an injury with Fall?: 0 Fall Risk Category Calculator: 0 Patient Fall Risk Level: Low Fall Risk  Fall Risk Patient at Risk for Falls Due to: No Fall Risks Fall risk Follow up: Falls evaluation completed; Education provided  Home and Transportation Safety: All rugs have non-skid backing?: yes All stairs or steps have railings?: yes Grab bars in the bathtub or shower?: yes Have non-skid surface in bathtub or shower?: yes Good home lighting?: yes Regular seat belt use?: yes Hospital stays in the last year:: no  Cognitive Assessment Difficulty concentrating, remembering, or making decisions? : no Will 6CIT or Mini Cog be Completed: yes What year is it?: 0 points What month is it?: 0 points Give patient an address phrase to remember (5 components): [address] [place] About what time is it?: 0 points Count backwards from 20 to 1: 0 points Say the months of the year in reverse: 0 points Repeat the address phrase from earlier: 0 points 6 CIT Score: 0 points  Advance Directives (For Healthcare) Does Patient Have a Medical Advance Directive?: No Would patient like information on creating a medical advance directive?: No - Patient declined  Reviewed/Updated  Reviewed/Updated: Reviewed All (Medical, Surgical, Family, Medications, Allergies, Care Teams, Patient Goals); Medical History; Surgical History; Family History; Medications; Allergies; Care Teams; Patient Goals    Allergies (verified) Patient has no known allergies.   Current Medications (verified) Outpatient Encounter Medications as of 04/13/2024  Medication Sig   albuterol  (VENTOLIN  HFA) 108 (90 Base) MCG/ACT inhaler INHALE 1-2 PUFFS BY MOUTH EVERY 6 HOURS AS NEEDED FOR WHEEZE OR SHORTNESS OF BREATH   Ascorbic Acid (VITAMIN C) 500 MG CAPS Take by mouth daily.   atorvastatin  (LIPITOR) 20 MG tablet TAKE 1 TABLET BY MOUTH EVERY DAY   CALCIUM  PO Take by mouth  daily. TAKE ONE   clobetasol  (TEMOVATE ) 0.05 % GEL  Apply 1 Application topically 2 (two) times daily.   Multiple Vitamin (MULTIVITAMIN WITH MINERALS) TABS tablet Take 1 tablet by mouth daily.   Nutritional Supplements (VITAMIN D  BOOSTER PO) Take by mouth daily.   pantoprazole  (PROTONIX ) 40 MG tablet TAKE 1 TABLET BY MOUTH EVERY DAY   No facility-administered encounter medications on file as of 04/13/2024.    History: Past Medical History:  Diagnosis Date   Allergy    SEASONAL   Asthma    GERD (gastroesophageal reflux disease)    Hyperlipidemia    Osteopenia    Past Surgical History:  Procedure Laterality Date   COLONOSCOPY     TUBAL LIGATION     Family History  Problem Relation Age of Onset   Cancer Mother        cervical, bladder, tumor on urethra   Colon polyps Father    Hyperlipidemia Father    Dementia Father    Parkinson's disease Father    Colon cancer Neg Hx    Crohn's disease Neg Hx    Esophageal cancer Neg Hx    Rectal cancer Neg Hx    Stomach cancer Neg Hx    Ulcerative colitis Neg Hx    Social History   Occupational History   Not on file  Tobacco Use   Smoking status: Never   Smokeless tobacco: Never  Vaping Use   Vaping status: Never Used  Substance and Sexual Activity   Alcohol use: Yes    Comment: OCCASSIONALLY   Drug use: No   Sexual activity: Yes    Birth control/protection: Post-menopausal   Tobacco Counseling Counseling given: Yes  SDOH Screenings   Food Insecurity: No Food Insecurity (04/13/2024)  Housing: Unknown (04/13/2024)  Transportation Needs: No Transportation Needs (04/13/2024)  Utilities: Not At Risk (04/13/2024)  Alcohol Screen: Low Risk (04/10/2022)  Depression (PHQ2-9): Low Risk (04/13/2024)  Financial Resource Strain: Low Risk (04/10/2022)  Physical Activity: Insufficiently Active (04/13/2024)  Social Connections: Moderately Integrated (04/13/2024)  Stress: No Stress Concern Present (04/13/2024)  Tobacco Use: Low Risk (04/13/2024)  Health Literacy: Adequate Health Literacy  (04/13/2024)   See flowsheets for full screening details  Depression Screen PHQ 2 & 9 Depression Scale- Over the past 2 weeks, how often have you been bothered by any of the following problems? Little interest or pleasure in doing things: 0 Feeling down, depressed, or hopeless (PHQ Adolescent also includes...irritable): 0 PHQ-2 Total Score: 0     Goals Addressed             This Visit's Progress    Remain active and independent   On track            Objective:    Today's Vitals   04/13/24 0926  BP: 106/73  Pulse: 73  Weight: 153 lb (69.4 kg)  Height: 5' 2 (1.575 m)   Body mass index is 27.98 kg/m.  Hearing/Vision screen No results found. Immunizations and Health Maintenance Health Maintenance  Topic Date Due   Zoster Vaccines- Shingrix (1 of 2) Never done   COVID-19 Vaccine (3 - 2025-26 season) 12/02/2023   Pneumococcal Vaccine: 50+ Years (1 of 1 - PCV) 10/13/2024 (Originally 03/11/2005)   Mammogram  11/26/2024   Medicare Annual Wellness (AWV)  04/13/2025   DTaP/Tdap/Td (2 - Td or Tdap) 06/01/2025   Bone Density Scan  11/05/2025   Colonoscopy  07/02/2032   Influenza Vaccine  Completed   Hepatitis C Screening  Completed   Meningococcal B Vaccine  Aged Out        Assessment/Plan:  This is a routine wellness examination for [place].  Patient Care Team: Gladis Mustard, FNP as PCP - General (Nurse Practitioner) Dr Willma [facility], OD  I have personally reviewed and noted the following in the patients chart:   Medical and social history Use of alcohol, tobacco or illicit drugs  Current medications and supplements including opioid prescriptions. Functional ability and status Nutritional status Physical activity Advanced directives List of other physicians Hospitalizations, surgeries, and ER visits in previous 12 months Vitals Screenings to include cognitive, depression, and falls Referrals and appointments  No orders of the  defined types were placed in this encounter.  In addition, I have reviewed and discussed with patient certain preventive protocols, quality metrics, and best practice recommendations. A written personalized care plan for preventive services as well as general preventive health recommendations were provided to patient.   Ozie Ned, CMA   04/13/2024   Return in 1 year (on 04/13/2025).  After Visit Summary: (MyChart) Due to this being a telephonic visit, the after visit summary with patients personalized plan was offered to patient via MyChart   Nurse Notes: Per is aware and due the following: Covid and shingles vaccines "

## 2024-04-13 NOTE — Patient Instructions (Signed)
 Ms. Manzi,  Thank you for taking the time for your Medicare Wellness Visit. I appreciate your continued commitment to your health goals. Please review the care plan we discussed, and feel free to reach out if I can assist you further.  Please note that Annual Wellness Visits do not include a physical exam. Some assessments may be limited, especially if the visit was conducted virtually. If needed, we may recommend an in-person follow-up with your provider.  Ongoing Care Seeing your primary care provider every 3 to 6 months helps us  monitor your health and provide consistent, personalized care.   Referrals If a referral was made during today's visit and you haven't received any updates within two weeks, please contact the referred provider directly to check on the status.  Recommended Screenings:  Health Maintenance  Topic Date Due   Zoster (Shingles) Vaccine (1 of 2) Never done   COVID-19 Vaccine (3 - 2025-26 season) 12/02/2023   Medicare Annual Wellness Visit  04/11/2024   Pneumococcal Vaccine for age over 40 (1 of 1 - PCV) 10/13/2024*   Breast Cancer Screening  11/26/2024   DTaP/Tdap/Td vaccine (2 - Td or Tdap) 06/01/2025   Osteoporosis screening with Bone Density Scan  11/05/2025   Colon Cancer Screening  07/02/2032   Flu Shot  Completed   Hepatitis C Screening  Completed   Meningitis B Vaccine  Aged Out  *Topic was postponed. The date shown is not the original due date.       04/13/2024    9:28 AM  Advanced Directives  Does Patient Have a Medical Advance Directive? No  Would patient like information on creating a medical advance directive? No - Patient declined    Vision: Annual vision screenings are recommended for early detection of glaucoma, cataracts, and diabetic retinopathy. These exams can also reveal signs of chronic conditions such as diabetes and high blood pressure.  Dental: Annual dental screenings help detect early signs of oral cancer, gum disease, and other  conditions linked to overall health, including heart disease and diabetes.  Please see the attached documents for additional preventive care recommendations.

## 2024-04-23 ENCOUNTER — Other Ambulatory Visit: Payer: Self-pay | Admitting: Nurse Practitioner

## 2024-04-23 DIAGNOSIS — E785 Hyperlipidemia, unspecified: Secondary | ICD-10-CM

## 2024-04-23 DIAGNOSIS — K219 Gastro-esophageal reflux disease without esophagitis: Secondary | ICD-10-CM

## 2024-04-24 ENCOUNTER — Ambulatory Visit (INDEPENDENT_AMBULATORY_CARE_PROVIDER_SITE_OTHER): Payer: Self-pay | Admitting: Nurse Practitioner

## 2024-04-24 ENCOUNTER — Encounter: Payer: Self-pay | Admitting: Nurse Practitioner

## 2024-04-24 VITALS — BP 114/73 | HR 71 | Temp 96.6°F | Ht 62.0 in | Wt 149.0 lb

## 2024-04-24 DIAGNOSIS — N904 Leukoplakia of vulva: Secondary | ICD-10-CM | POA: Diagnosis not present

## 2024-04-24 DIAGNOSIS — K219 Gastro-esophageal reflux disease without esophagitis: Secondary | ICD-10-CM

## 2024-04-24 DIAGNOSIS — E559 Vitamin D deficiency, unspecified: Secondary | ICD-10-CM | POA: Diagnosis not present

## 2024-04-24 DIAGNOSIS — E785 Hyperlipidemia, unspecified: Secondary | ICD-10-CM | POA: Diagnosis not present

## 2024-04-24 DIAGNOSIS — M85852 Other specified disorders of bone density and structure, left thigh: Secondary | ICD-10-CM

## 2024-04-24 DIAGNOSIS — Z6827 Body mass index (BMI) 27.0-27.9, adult: Secondary | ICD-10-CM | POA: Diagnosis not present

## 2024-04-24 DIAGNOSIS — M85851 Other specified disorders of bone density and structure, right thigh: Secondary | ICD-10-CM | POA: Diagnosis not present

## 2024-04-24 LAB — CBC WITH DIFFERENTIAL/PLATELET
Basophils Absolute: 0.1 x10E3/uL (ref 0.0–0.2)
Basos: 1 %
EOS (ABSOLUTE): 0.6 x10E3/uL — ABNORMAL HIGH (ref 0.0–0.4)
Eos: 7 %
Hematocrit: 45.6 % (ref 34.0–46.6)
Hemoglobin: 14.6 g/dL (ref 11.1–15.9)
Immature Grans (Abs): 0 x10E3/uL (ref 0.0–0.1)
Immature Granulocytes: 0 %
Lymphocytes Absolute: 2.1 x10E3/uL (ref 0.7–3.1)
Lymphs: 24 %
MCH: 30.2 pg (ref 26.6–33.0)
MCHC: 32 g/dL (ref 31.5–35.7)
MCV: 94 fL (ref 79–97)
Monocytes Absolute: 0.9 x10E3/uL (ref 0.1–0.9)
Monocytes: 10 %
Neutrophils Absolute: 5.1 x10E3/uL (ref 1.4–7.0)
Neutrophils: 58 %
Platelets: 285 x10E3/uL (ref 150–450)
RBC: 4.84 x10E6/uL (ref 3.77–5.28)
RDW: 13.2 % (ref 11.7–15.4)
WBC: 8.6 x10E3/uL (ref 3.4–10.8)

## 2024-04-24 LAB — LIPID PANEL
Chol/HDL Ratio: 2.5 ratio (ref 0.0–4.4)
Cholesterol, Total: 160 mg/dL (ref 100–199)
HDL: 65 mg/dL
LDL Chol Calc (NIH): 81 mg/dL (ref 0–99)
Triglycerides: 75 mg/dL (ref 0–149)
VLDL Cholesterol Cal: 14 mg/dL (ref 5–40)

## 2024-04-24 LAB — CMP14+EGFR
ALT: 22 IU/L (ref 0–32)
AST: 22 IU/L (ref 0–40)
Albumin: 4.4 g/dL (ref 3.9–4.9)
Alkaline Phosphatase: 77 IU/L (ref 49–135)
BUN/Creatinine Ratio: 12 (ref 12–28)
BUN: 12 mg/dL (ref 8–27)
Bilirubin Total: 0.8 mg/dL (ref 0.0–1.2)
CO2: 23 mmol/L (ref 20–29)
Calcium: 9.7 mg/dL (ref 8.7–10.3)
Chloride: 105 mmol/L (ref 96–106)
Creatinine, Ser: 1.01 mg/dL — ABNORMAL HIGH (ref 0.57–1.00)
Globulin, Total: 2.2 g/dL (ref 1.5–4.5)
Glucose: 100 mg/dL — ABNORMAL HIGH (ref 70–99)
Potassium: 4.2 mmol/L (ref 3.5–5.2)
Sodium: 142 mmol/L (ref 134–144)
Total Protein: 6.6 g/dL (ref 6.0–8.5)
eGFR: 60 mL/min/1.73

## 2024-04-24 MED ORDER — CLOBETASOL PROPIONATE 0.05 % EX GEL: 1.0000 | Freq: Two times a day (BID) | CUTANEOUS | 3 refills | Status: AC

## 2024-04-24 MED ORDER — ATORVASTATIN CALCIUM 20 MG PO TABS: 20.0000 mg | ORAL_TABLET | Freq: Every day | ORAL | 1 refills | Status: AC

## 2024-04-24 MED ORDER — PANTOPRAZOLE SODIUM 40 MG PO TBEC: 40.0000 mg | DELAYED_RELEASE_TABLET | Freq: Every day | ORAL | 1 refills | Status: AC

## 2024-04-24 NOTE — Patient Instructions (Signed)
 Itchy, Painful, White Skin Patches (Lichen Sclerosus): What to Know Lichen sclerosus is a skin problem. It can happen on any part of your body. It happens most often in the areas around your genitals or the opening of your butt (anus). Treatment can help to control symptoms. It can also help prevent scarring that may lead to other problems. This skin problem isn't an infection or a fungus. It can't be passed from person to person. What are the causes? The cause of this condition isn't known. It may be related to: The body's defense system, also called the immune system, reacting too strongly. A lack of certain hormones. What increases the risk? Being a female who has stopped having periods. This is called menopause. Being a female who wasn't circumcised. Being a child who hasn't reached puberty yet. What are the signs or symptoms?  Thin, wrinkled, white areas on the skin. Thickened white areas on the skin. Red and swollen patches on the skin. Tears or cracks in the skin. Bruising. Blood blisters. Very bad itching. Pain, itching, or burning when peeing. Trouble pooping, especially in children. How is this treated? This condition may be treated with: Topical steroids. These are creams or ointments that are put on the skin. Medicines that are taken by mouth. Topical immunotherapy. These are creams and ointments that you put on the skin to give your body's defense system a boost. Surgery. This may need to be done if there are problems like scarring. Follow these instructions at home: Medicines Take or apply your medicines only as told. Use creams or ointments as told by your doctor. Skin care Do not scratch the affected areas. Keep the affected areas clean and dry. Clean the affected area gently with water only. Avoid using rough towels or toilet paper. Avoid products that irritate the skin. These include scented soaps, lotions, and bubble bath. Use thick creams or ointments to lessen  itching as told. General instructions Your condition may cause trouble pooping. To help prevent or treat this, you may need to: Take medicines to help you poop. Eat foods high in fiber, like beans, whole grains, and fresh fruits and vegetables. Drink more fluids as told. Keep all follow-up visits. This helps make sure the treatment plan is working. Contact a doctor if: You have more redness, swelling, or pain. You have fluid, blood, or pus coming from the area. You have new patches on your skin. You have a fever. You have pain during sex. This information is not intended to replace advice given to you by your health care provider. Make sure you discuss any questions you have with your health care provider. Document Revised: 10/23/2022 Document Reviewed: 10/23/2022 Elsevier Patient Education  2024 [employer].

## 2024-04-24 NOTE — Progress Notes (Signed)
 "  Subjective:    Patient ID: Janice Long, female    DOB: [birth year], 69 y.o.   MRN: [id number]   Chief Complaint: medical management of chronic issues     HPI:  Janice Long is a 69 y.o. who identifies as a female who was assigned female at birth.   Social history: Lives with: husband Work history: retired from [employer] in today for follow up of the following chronic medical issues:  1. Hyperlipidemia with target LDL less than 100 Does try to watch diet and does no dedicated exercise. Lab Results  Component Value Date   CHOL 165 10/28/2023   HDL 67 10/28/2023   LDLCALC 78 10/28/2023   TRIG 114 10/28/2023   CHOLHDL 2.5 10/28/2023   The 10-year ASCVD risk score (Arnett DK, et al., 2019) is: 5.3%   2. Osteopenia of necks of both femurs Last dexascan was done on 06/14/20. Her t score was -1.2. does not weight bearing exercise.  3. Vitamin D  deficiency Takes a daily multivitamin  4. GERD Takes protonix  daily and is doing well   5.  Lichen sclerosus of vulva Was given steroids and clobetasol  at last visit and she says it has improved some. Still has itching daily.   6. BMI 27.0-27.9, Weight is up 6lbs  Wt Readings from Last 3 Encounters:  04/24/24 149 lb (67.6 kg)  04/13/24 153 lb (69.4 kg)  10/28/23 153 lb (69.4 kg)   BMI Readings from Last 3 Encounters:  04/24/24 27.25 kg/m  04/13/24 27.98 kg/m  10/28/23 27.98 kg/m       New complaints: None today  No Known Allergies Outpatient Encounter Medications as of 04/24/2024  Medication Sig   albuterol  (VENTOLIN  HFA) 108 (90 Base) MCG/ACT inhaler INHALE 1-2 PUFFS BY MOUTH EVERY 6 HOURS AS NEEDED FOR WHEEZE OR SHORTNESS OF BREATH   Ascorbic Acid (VITAMIN C) 500 MG CAPS Take by mouth daily.   atorvastatin  (LIPITOR) 20 MG tablet TAKE 1 TABLET BY MOUTH EVERY DAY   CALCIUM  PO Take by mouth daily. TAKE ONE   clobetasol  (TEMOVATE ) 0.05 % GEL Apply 1 Application topically 2 (two) times daily.    Multiple Vitamin (MULTIVITAMIN WITH MINERALS) TABS tablet Take 1 tablet by mouth daily.   Nutritional Supplements (VITAMIN D  BOOSTER PO) Take by mouth daily.   pantoprazole  (PROTONIX ) 40 MG tablet TAKE 1 TABLET BY MOUTH EVERY DAY   No facility-administered encounter medications on file as of 04/24/2024.    Past Surgical History:  Procedure Laterality Date   COLONOSCOPY     TUBAL LIGATION      Family History  Problem Relation Age of Onset   Cancer Mother        cervical, bladder, tumor on urethra   Colon polyps Father    Hyperlipidemia Father    Dementia Father    Parkinson's disease Father    Colon cancer Neg Hx    Crohn's disease Neg Hx    Esophageal cancer Neg Hx    Rectal cancer Neg Hx    Stomach cancer Neg Hx    Ulcerative colitis Neg Hx       Controlled substance contract: n/a     Review of Systems  Constitutional:  Negative for diaphoresis.  Eyes:  Negative for pain.  Respiratory:  Negative for shortness of breath.   Cardiovascular:  Negative for chest pain, palpitations and leg swelling.  Gastrointestinal:  Negative for abdominal pain.  Endocrine: Negative for polydipsia.  Skin:  Negative for rash.  Neurological:  Negative for dizziness, weakness and headaches.  Hematological:  Does not bruise/bleed easily.  All other systems reviewed and are negative.      Objective:   Physical Exam Vitals and nursing note reviewed.  Constitutional:      General: She is not in acute distress.    Appearance: Normal appearance. She is well-developed.  HENT:     Head: Normocephalic.     Right Ear: Tympanic membrane normal.     Left Ear: Tympanic membrane normal.     Nose: Nose normal.     Mouth/Throat:     Mouth: Mucous membranes are moist.  Eyes:     Pupils: Pupils are equal, round, and reactive to light.  Neck:     Vascular: No carotid bruit or JVD.  Cardiovascular:     Rate and Rhythm: Normal rate and regular rhythm.     Heart sounds: Normal heart sounds.   Pulmonary:     Effort: Pulmonary effort is normal. No respiratory distress.     Breath sounds: Normal breath sounds. No wheezing or rales.  Chest:     Chest wall: No tenderness.  Abdominal:     General: Bowel sounds are normal. There is no distension or abdominal bruit.     Palpations: Abdomen is soft. There is no hepatomegaly, splenomegaly, mass or pulsatile mass.     Tenderness: There is no abdominal tenderness.  Genitourinary:    Vagina: No vaginal discharge.  Musculoskeletal:        General: Normal range of motion.     Cervical back: Normal range of motion and neck supple.  Lymphadenopathy:     Cervical: No cervical adenopathy.  Skin:    General: Skin is warm and dry.  Neurological:     Mental Status: She is alert and oriented to person, place, and time.     Deep Tendon Reflexes: Reflexes are normal and symmetric.  Psychiatric:        Behavior: Behavior normal.        Thought Content: Thought content normal.        Judgment: Judgment normal.     BP 114/73   Pulse 71   Temp (!) 96.6 F (35.9 C) (Temporal)   Ht 5' 2 (1.575 m)   Wt 149 lb (67.6 kg)   SpO2 96%   BMI 27.25 kg/m       Assessment & Plan:  Janice Long in today with chief complaint of Medical Management of Chronic Issues   1. Hyperlipidemia with target LDL less than 100 (Primary) Low fat diet - atorvastatin  (LIPITOR) 20 MG tablet; Take 1 tablet (20 mg total) by mouth daily.  Dispense: 90 tablet; Refill: 1 - CBC with Differential/Platelet - CMP14+EGFR - Lipid panel  2. Osteopenia of necks of both femurs Weight bearing exercise   3. Vitamin D  deficiency Continue vitamin d  supplement  4. BMI 27.0-27.9,adult Discussed diet and exercise for person with BMI >25 Will recheck weight in 3-6 months   5. Lichen sclerosus of female genitalia Avoid scratching area Avoid harsh soaps - clobetasol  (TEMOVATE ) 0.05 % GEL; Apply 1 Application topically 2 (two) times daily.  Dispense: 60 g; Refill:  3  6. Gastroesophageal reflux disease without esophagitis Avoid spicy foods Do not eat 2 hours prior to bedtime  - pantoprazole  (PROTONIX ) 40 MG tablet; Take 1 tablet (40 mg total) by mouth daily.  Dispense: 90 tablet; Refill: 1    The above assessment and management plan  was discussed with the patient. The patient verbalized understanding of and has agreed to the management plan. Patient is aware to call the clinic if symptoms persist or worsen. Patient is aware when to return to the clinic for a follow-up visit. Patient educated on when it is appropriate to go to the emergency department.   Mary-Margaret Gladis, FNP   "

## 2024-04-28 ENCOUNTER — Ambulatory Visit: Payer: Self-pay | Admitting: Nurse Practitioner

## 2024-10-27 ENCOUNTER — Ambulatory Visit: Admitting: Nurse Practitioner

## 2025-04-14 ENCOUNTER — Ambulatory Visit
